# Patient Record
Sex: Female | Born: 1935 | Race: Black or African American | Hispanic: No | Smoking: Never smoker
Health system: Southern US, Community
[De-identification: ages and names within clinical notes are randomized; demographics above are authoritative.]

## PROBLEM LIST (undated history)

## (undated) DIAGNOSIS — F0390 Unspecified dementia without behavioral disturbance: Secondary | ICD-10-CM

## (undated) DIAGNOSIS — E079 Disorder of thyroid, unspecified: Secondary | ICD-10-CM

## (undated) DIAGNOSIS — N189 Chronic kidney disease, unspecified: Secondary | ICD-10-CM

## (undated) DIAGNOSIS — I10 Essential (primary) hypertension: Secondary | ICD-10-CM

## (undated) DIAGNOSIS — C801 Malignant (primary) neoplasm, unspecified: Secondary | ICD-10-CM

## (undated) DIAGNOSIS — E119 Type 2 diabetes mellitus without complications: Secondary | ICD-10-CM

## (undated) HISTORY — DX: Type 2 diabetes mellitus without complications: E11.9

## (undated) HISTORY — DX: Malignant (primary) neoplasm, unspecified: C80.1

## (undated) HISTORY — DX: Essential (primary) hypertension: I10

## (undated) HISTORY — DX: Chronic kidney disease, unspecified: N18.9

## (undated) HISTORY — DX: Disorder of thyroid, unspecified: E07.9

---

## 2015-10-17 ENCOUNTER — Encounter: Payer: Self-pay | Admitting: Internal Medicine

## 2015-10-17 ENCOUNTER — Ambulatory Visit (INDEPENDENT_AMBULATORY_CARE_PROVIDER_SITE_OTHER): Payer: Medicare (Managed Care) | Admitting: Internal Medicine

## 2015-10-17 VITALS — BP 214/60 | HR 73 | Temp 97.4°F | Resp 16 | Ht 67.0 in | Wt 167.0 lb

## 2015-10-17 DIAGNOSIS — Z992 Dependence on renal dialysis: Secondary | ICD-10-CM

## 2015-10-17 DIAGNOSIS — I509 Heart failure, unspecified: Secondary | ICD-10-CM | POA: Diagnosis not present

## 2015-10-17 DIAGNOSIS — Z8739 Personal history of other diseases of the musculoskeletal system and connective tissue: Secondary | ICD-10-CM

## 2015-10-17 DIAGNOSIS — E119 Type 2 diabetes mellitus without complications: Secondary | ICD-10-CM | POA: Diagnosis not present

## 2015-10-17 DIAGNOSIS — E1122 Type 2 diabetes mellitus with diabetic chronic kidney disease: Secondary | ICD-10-CM | POA: Diagnosis not present

## 2015-10-17 DIAGNOSIS — R413 Other amnesia: Secondary | ICD-10-CM | POA: Diagnosis not present

## 2015-10-17 DIAGNOSIS — E038 Other specified hypothyroidism: Secondary | ICD-10-CM

## 2015-10-17 DIAGNOSIS — Z8639 Personal history of other endocrine, nutritional and metabolic disease: Secondary | ICD-10-CM

## 2015-10-17 DIAGNOSIS — I10 Essential (primary) hypertension: Secondary | ICD-10-CM

## 2015-10-17 DIAGNOSIS — I5022 Chronic systolic (congestive) heart failure: Secondary | ICD-10-CM

## 2015-10-17 DIAGNOSIS — N186 End stage renal disease: Secondary | ICD-10-CM

## 2015-10-17 MED ORDER — VITAMIN B-12 1000 MCG PO TABS: 1000.0000 ug | ORAL_TABLET | Freq: Every day | ORAL | Status: AC

## 2015-10-17 MED ORDER — CLONIDINE HCL 0.2 MG PO TABS
0.2000 mg | ORAL_TABLET | Freq: Three times a day (TID) | ORAL | Status: AC
Start: 2015-10-17 — End: ?

## 2015-10-17 NOTE — Progress Notes (Signed)
Pre visit review using our clinic review tool, if applicable. No additional management support is needed unless otherwise documented below in the visit note. 

## 2015-10-17 NOTE — Patient Instructions (Signed)
   Medications reviewed and updated.  No changes recommended at this time.  Your prescription(s) have been submitted to your pharmacy. Please take as directed and contact our office if you believe you are having problem(s) with the medication(s).  A referral was ordered for cardiology, podiatry and neurology.    Please followup in one month

## 2015-10-17 NOTE — Progress Notes (Signed)
Subjective:    Patient ID: Shelly Cain, female    DOB: 05-06-1935, 81 y.o.   MRN: [id number]  HPI She is here to establish with a new pcp.  She is with her son and daughter-in-law.  2 months ago she was moved down here from [place] and is living with her son. All of the details of her history is not known.  Heart failure: Her family noticed that she has a very weak heart. She was following with cardiology in [place]. They do not have any details regarding her history. She does have shortness of breath with exertion. She denies any chest pain or palpitations. She does have lower extremity edema and is on hemodialysis for chronic kidney disease.  Chronic kidney disease, endstage: She has hemodialysis 3 days a week.  Diabetes: She has her sugars checked at hemodialysis 3 days a week. She is taking her medication daily-her family gives it to her. The patient and her family are not aware of her most recent sugar numbers were if her diabetes is controlled.  Hypertension: She is taking her medication daily. She did run out of the clonidine yesterday and has noticed a couple of doses. Her kidney doctor mentioned to her family that her blood pressure was not well controlled.  Gout: She has a history of gout. She is unable to tell me the last episode that she had. She is taking allopurinol 100 mg daily.  Hypothyroidism: She is taking her medication daily.  Concern for memory difficulties/dementia: The nurses at hemodialysis have expressed concern regarding her memory and thought she should be evaluated. Her family notices that she often gets very angry, especially if she is not given full independence. She wonders at night and will walk the hallways. She often will eat several times when they know she probably is not hungry. It is difficult more detail with her in the room because she denies any issues with memory. She states she has been a memory test in the past and passed.  Her family  increase to a neurological evaluation. They did express concern to me privately regarding obtaining power of attorney, which they currently do not have.    Medications and allergies reviewed with patient and updated if appropriate.  Patient Active Problem List   Diagnosis Date Noted  . Diabetes ([place]) 10/18/2015  . Kidney disease, chronic, end stage on dialysis ([place]) 10/18/2015  . Essential hypertension, malignant 10/18/2015  . Hypothyroidism 10/18/2015  . History of gout 10/18/2015     Medication List       This list is accurate as of: 10/17/15 11:59 PM.  Always use your most recent med list.               acetaminophen 325 MG tablet  Commonly known as:  TYLENOL  Take 650 mg by mouth every 4 (four) hours as needed.     allopurinol 100 MG tablet  Commonly known as:  ZYLOPRIM  Take 100 mg by mouth daily.     amLODipine 10 MG tablet  Commonly known as:  NORVASC  Take 10 mg by mouth daily.     aspirin 81 MG tablet  Take 81 mg by mouth daily.     cloNIDine 0.2 MG tablet  Commonly known as:  CATAPRES  Take 1 tablet (0.2 mg total) by mouth 3 (three) times daily.     hydrALAZINE 100 MG tablet  Commonly known as:  APRESOLINE  Take 100 mg by mouth 2 (  two) times daily.     isosorbide dinitrate 20 MG tablet  Commonly known as:  ISORDIL  Take 20 mg by mouth 3 (three) times daily.     levothyroxine 25 MCG tablet  Commonly known as:  SYNTHROID, LEVOTHROID  Take 25 mcg by mouth daily before breakfast.     lisinopril 20 MG tablet  Commonly known as:  PRINIVIL,ZESTRIL  Take 20 mg by mouth daily.     minoxidil 2.5 MG tablet  Commonly known as:  LONITEN  Take 5 mg by mouth 2 (two) times daily.     PRO-STAT PO  Take 30 mLs by mouth 3 (three) times daily.     sevelamer carbonate 800 MG tablet  Commonly known as:  RENVELA  Take 800 mg by mouth 2 (two) times daily with a meal.     TRADJENTA 5 MG Tabs tablet  Generic drug:  linagliptin  Take 5 mg by mouth daily.      vitamin B-12 1000 MCG tablet  Commonly known as:  CYANOCOBALAMIN  Take 1 tablet (1,000 mcg total) by mouth daily.         Past Medical History  Diagnosis Date  . Hypertension   . Diabetes mellitus without complication ([place])   . Chronic kidney disease   . Cancer [facility])     Colon  . Thyroid disease     History reviewed. No pertinent past surgical history.  Social History   Social History  . Marital Status: Widowed    Spouse Name: N/A  . Number of Children: N/A  . Years of Education: N/A   Social History Main Topics  . Smoking status: Never Smoker   . Smokeless tobacco: Never Used  . Alcohol Use: No  . Drug Use: No  . Sexual Activity: Not Asked   Other Topics Concern  . None   Social History Narrative  . None    History reviewed. No pertinent family history.  Review of Systems  Constitutional: Negative for fever and chills.  HENT: Negative for hearing loss.   Eyes: Negative for visual disturbance.  Respiratory: Negative for cough, shortness of breath and wheezing.   Cardiovascular: Positive for leg swelling. Negative for chest pain and palpitations.  Gastrointestinal: Negative for abdominal pain, diarrhea and constipation.       No gerd  Genitourinary: Negative for difficulty urinating.  Musculoskeletal: Positive for arthralgias.  Neurological: Negative for dizziness, weakness, light-headedness, numbness and headaches.       Poor balance - uses walker  Psychiatric/Behavioral: Positive for sleep disturbance. Negative for dysphoric mood. The patient is not nervous/anxious.        No memory problems Years - 2017 city:  [place], [place] President: " with the hair"  Takes own medication -- needs reminding per family        Objective:   Filed Vitals:   10/17/15 1533 10/17/15 1546  BP: 230/80 214/60  Pulse: 73   Temp: 97.4 F (36.3 C)   Resp: 16    Filed Weights   10/17/15 1533  Weight: 167 lb (75.751 kg)   Body mass index is 26.15 kg/(m^2).    Physical Exam Constitutional: She appears well-developed and well-nourished. No distress.  HENT:  Head: Normocephalic and atraumatic.  Right Ear: External ear normal. Normal ear canal and TM Left Ear: External ear normal.  Normal ear canal and TM Mouth/Throat: Oropharynx is clear and moist.  Eyes: Conjunctivae and EOM are normal.  Neck: Neck supple. No tracheal deviation present. No thyromegaly  present.  No carotid bruit  Cardiovascular: Normal rate, regular rhythm and normal heart sounds.   3/6 systolic heard.  mild ankle edema. Pulmonary/Chest: Effort normal and breath sounds normal. No respiratory distress. She has no wheezes. She has no rales.  Abdominal: Soft. She exhibits no distension. There is no tenderness.  Lymphadenopathy: She has no cervical adenopathy.  Skin: Skin is warm and dry. She is not diaphoretic.  Psychiatric: She has a normal mood and affect. Her behavior is normal.       Assessment & Plan:   See Problem List for Assessment and Plan of chronic medical problems.  Follow-up in one month

## 2015-10-18 DIAGNOSIS — E119 Type 2 diabetes mellitus without complications: Secondary | ICD-10-CM | POA: Insufficient documentation

## 2015-10-18 DIAGNOSIS — Z992 Dependence on renal dialysis: Secondary | ICD-10-CM

## 2015-10-18 DIAGNOSIS — Z8739 Personal history of other diseases of the musculoskeletal system and connective tissue: Secondary | ICD-10-CM | POA: Insufficient documentation

## 2015-10-18 DIAGNOSIS — N186 End stage renal disease: Secondary | ICD-10-CM | POA: Insufficient documentation

## 2015-10-18 DIAGNOSIS — I10 Essential (primary) hypertension: Secondary | ICD-10-CM | POA: Insufficient documentation

## 2015-10-18 DIAGNOSIS — E039 Hypothyroidism, unspecified: Secondary | ICD-10-CM | POA: Insufficient documentation

## 2015-10-18 DIAGNOSIS — I5022 Chronic systolic (congestive) heart failure: Secondary | ICD-10-CM | POA: Insufficient documentation

## 2015-10-18 DIAGNOSIS — R413 Other amnesia: Secondary | ICD-10-CM | POA: Insufficient documentation

## 2015-10-18 NOTE — Assessment & Plan Note (Signed)
Her family knows that she has a very weak heart, but no other details. Significant murmur on exam We will work on obtaining records from [place] I will refer to cardiology - will let them obtain echo

## 2015-10-18 NOTE — Assessment & Plan Note (Signed)
Doing hemodialysis 3 times a week

## 2015-10-18 NOTE — Assessment & Plan Note (Signed)
Need to check TSH and adjust medication as needed We'll see if she can have complete blood work done at hemodialysis since that we do not have to get blood work

## 2015-10-18 NOTE — Assessment & Plan Note (Signed)
Referral to neurology for full evaluation

## 2015-10-18 NOTE — Assessment & Plan Note (Signed)
?   Last gout attack Continue allopurinol 100 mg daily

## 2015-10-18 NOTE — Assessment & Plan Note (Addendum)
We'll see if A1c can be checked at hemodialysis We will try to obtain most recent records from hemodialysis to evaluate her glucose Continue current medications for now Referral to podiatry for foot care

## 2015-10-18 NOTE — Assessment & Plan Note (Signed)
Poorly controlled, but she did not take one of her medications for the past 24 hours. Still likely not controlled Medication renewed Follow-up in one month Referred to cardiology for further evaluation and help with hypertension control

## 2015-10-21 ENCOUNTER — Encounter: Payer: Self-pay | Admitting: Neurology

## 2015-10-21 ENCOUNTER — Ambulatory Visit (INDEPENDENT_AMBULATORY_CARE_PROVIDER_SITE_OTHER): Payer: Medicare (Managed Care) | Admitting: Neurology

## 2015-10-21 VITALS — BP 190/60 | HR 75 | Ht 67.0 in | Wt 163.0 lb

## 2015-10-21 DIAGNOSIS — Z794 Long term (current) use of insulin: Secondary | ICD-10-CM

## 2015-10-21 DIAGNOSIS — Z992 Dependence on renal dialysis: Secondary | ICD-10-CM

## 2015-10-21 DIAGNOSIS — G309 Alzheimer's disease, unspecified: Secondary | ICD-10-CM

## 2015-10-21 DIAGNOSIS — N186 End stage renal disease: Secondary | ICD-10-CM | POA: Diagnosis not present

## 2015-10-21 DIAGNOSIS — N2889 Other specified disorders of kidney and ureter: Secondary | ICD-10-CM

## 2015-10-21 DIAGNOSIS — F0280 Dementia in other diseases classified elsewhere without behavioral disturbance: Secondary | ICD-10-CM

## 2015-10-21 DIAGNOSIS — I151 Hypertension secondary to other renal disorders: Secondary | ICD-10-CM | POA: Diagnosis not present

## 2015-10-21 DIAGNOSIS — E1122 Type 2 diabetes mellitus with diabetic chronic kidney disease: Secondary | ICD-10-CM

## 2015-10-21 MED ORDER — DONEPEZIL HCL 5 MG PO TABS: 5.0000 mg | ORAL_TABLET | Freq: Every day | ORAL | Status: DC

## 2015-10-21 NOTE — Progress Notes (Signed)
NEUROLOGY CONSULTATION NOTE  Shelly Cain MRN: [id number] DOB: 01-15-1935  Referring provider: Dr. Quay Burow Primary care provider: Dr. Quay Burow  Reason for consult:  Memory loss  HISTORY OF PRESENT ILLNESS: Shelly Cain is a 81 year old right-handed woman with hypertension, type 2 diabetes, end stage kidney disease on dialysis, hypothyroidism and history of colon cancer who presents for memory problems.  History obtained by patient, her son and PCP note.    Shelly Cain moved down to [place] from [place] in March.  She was living in her own apartment.  At that time, her son received a phone call stating that her mother was not doing well.  When his brother went up to see her, they found out she was in the hospital.  She was found to be in renal failure.  She was in rehab afterwards for 2 months.  When they entered her apartment, everything was in disarray.  Her bills were piled up and had not been paid.  She had lost significant amount of weight.  This was a difference since he last saw her over a year prior.  When speaking on the phone, she seemed okay.  She now lives with her son, daughter-in-law and grandson.  She is looked after by her son and daughter-in-law, who is a former [employer] scientist.  She has 24 hour supervision.  She demonstrates short-term memory problems.  When she walks to her bedroom, she sometimes enters the wrong room.  When she finishes dinner, she sometimes returns to the kitchen 5 minutes later and has another meal.  Overall, she is able to recognize her family but sometimes she appears not to recognize her son.  She is able to perform her ADLs, such as dressing and bathing, but requires assistance.  She is incontinent and wears diapers.  Her family manages her medication.  Sometimes in the evening, she wants to go out for a walk and will sit in the car.  She is up at night sometimes, but does not has not tried to leave the house.  Her daughter-in-law watches her in the  middle of the night when her son is sleeping.  She is not combative.  She has not demonstrated hallucinations.  She talks about the past often.  There is no known family history of dementia.  She went to school up until 10th grade.  She no longer drives. She takes B12 supplements.  PAST MEDICAL HISTORY: Past Medical History  Diagnosis Date  . Hypertension   . Diabetes mellitus without complication ([place])   . Chronic kidney disease   . Cancer [facility])     Colon  . Thyroid disease     PAST SURGICAL HISTORY: History reviewed. No pertinent past surgical history.  MEDICATIONS: Current Outpatient Prescriptions on File Prior to Visit  Medication Sig Dispense Refill  . acetaminophen (TYLENOL) 325 MG tablet Take 650 mg by mouth every 4 (four) hours as needed.    Marland Kitchen allopurinol (ZYLOPRIM) 100 MG tablet Take 100 mg by mouth daily.    . Amino Acids-Protein Hydrolys (PRO-STAT PO) Take 30 mLs by mouth 3 (three) times daily.    Marland Kitchen amLODipine (NORVASC) 10 MG tablet Take 10 mg by mouth daily.    Marland Kitchen aspirin 81 MG tablet Take 81 mg by mouth daily.    . cloNIDine (CATAPRES) 0.2 MG tablet Take 1 tablet (0.2 mg total) by mouth 3 (three) times daily. 270 tablet 1  . hydrALAZINE (APRESOLINE) 100 MG tablet Take 100 mg by  mouth 2 (two) times daily.    . isosorbide dinitrate (ISORDIL) 20 MG tablet Take 20 mg by mouth 3 (three) times daily.    Marland Kitchen levothyroxine (SYNTHROID, LEVOTHROID) 25 MCG tablet Take 25 mcg by mouth daily before breakfast.    . linagliptin (TRADJENTA) 5 MG TABS tablet Take 5 mg by mouth daily.    Marland Kitchen lisinopril (PRINIVIL,ZESTRIL) 20 MG tablet Take 20 mg by mouth daily.    . minoxidil (LONITEN) 2.5 MG tablet Take 5 mg by mouth 2 (two) times daily.    . sevelamer carbonate (RENVELA) 800 MG tablet Take 800 mg by mouth 2 (two) times daily with a meal.    . vitamin B-12 (CYANOCOBALAMIN) 1000 MCG tablet Take 1 tablet (1,000 mcg total) by mouth daily. 90 tablet 3   No current facility-administered  medications on file prior to visit.    ALLERGIES: No Known Allergies  FAMILY HISTORY: History reviewed. No pertinent family history.  SOCIAL HISTORY: Social History   Social History  . Marital Status: Widowed    Spouse Name: N/A  . Number of Children: N/A  . Years of Education: N/A   Occupational History  . Not on file.   Social History Main Topics  . Smoking status: Never Smoker   . Smokeless tobacco: Never Used  . Alcohol Use: No  . Drug Use: No  . Sexual Activity: Not on file   Other Topics Concern  . Not on file   Social History Narrative    REVIEW OF SYSTEMS: Constitutional: No fevers, chills, or sweats, no generalized fatigue, change in appetite Eyes: No visual changes, double vision, eye pain Ear, nose and throat: No hearing loss, ear pain, nasal congestion, sore throat Cardiovascular: No chest pain, palpitations Respiratory:  No shortness of breath at rest or with exertion, wheezes GastrointestinaI: No nausea, vomiting, diarrhea, abdominal pain, fecal incontinence Genitourinary:  No dysuria, urinary retention or frequency Musculoskeletal:  No neck pain, back pain Integumentary: No rash, pruritus, skin lesions Neurological: as above Psychiatric: No depression, insomnia, anxiety Endocrine: No palpitations, fatigue, diaphoresis, mood swings, change in appetite, change in weight, increased thirst Hematologic/Lymphatic:  No anemia, purpura, petechiae. Allergic/Immunologic: no itchy/runny eyes, nasal congestion, recent allergic reactions, rashes  PHYSICAL EXAM: Filed Vitals:   10/21/15 0857  BP: 190/60  Pulse: 75   General: No acute distress.   Head:  Normocephalic/atraumatic Eyes:  fundi examined but not visualized Neck: supple, no paraspinal tenderness, full range of motion Back: No paraspinal tenderness Heart: regular rate and rhythm Lungs: Clear to auscultation bilaterally. Vascular: No carotid bruits. Neurological Exam: Mental status: alert and  oriented to person, place, and day (but not month, date or year), recent memory poor, remote memory intact, fund of knowledge impaired, attention and concentration impaired, speech fluent and not dysarthric, difficulty with naming and naming fluency, able to answer questions and follow commands.  Visuospatial and executive functioning impaired. Montreal Cognitive Assessment  10/21/2015  Visuospatial/ Executive (0/5) 0  Naming (0/3) 1  Attention: Read list of digits (0/2) 2  Attention: Read list of letters (0/1) 0  Attention: Serial 7 subtraction starting at 100 (0/3) 0  Language: Repeat phrase (0/2) 2  Language : Fluency (0/1) 0  Abstraction (0/2) 0  Delayed Recall (0/5) 0  Orientation (0/6) 3  Total 8  Adjusted Score (based on education) 9   Cranial nerves: CN I: not tested CN II: pupils equal, round and reactive to light, visual fields intact CN III, IV, VI:  full range of  motion, no nystagmus, no ptosis CN V: facial sensation intact CN VII: upper and lower face symmetric CN VIII: hearing intact CN IX, X: gag intact, uvula midline CN XI: sternocleidomastoid and trapezius muscles intact CN XII: tongue midline Bulk & Tone: lower extremity atrophy, tone normal, no fasciculations. Motor:  4+/5 bilateral hip flexion, otherwise 5/5 throughout  Sensation:  Pinprick and vibration sensation intact. Deep Tendon Reflexes:  2+ throughout, toes downgoing.  Finger to nose testing:  Without dysmetria.  Gait:  Requires use of arms to push up out of seat.  Significant difficulty moving legs without walker.  IMPRESSION: Dementia, probably Alzheimer's End-stage renal disease Type 2 diabetes HTN  PLAN: 1.  We will start donepezil (Aricept) 5mg  daily for four weeks.  If tolerating the medication, then after four weeks, we will increase the dose to 10mg  daily.  Side effects discussed. 2.  I would like to get notes from [place] (hospital notes, labs, etc) for review 3.  Mrs. Dougal needs 24 hour  supervision 4.  We will order a Home Health assessment.  She will need physical therapy. 5.  Use walker at all times 6.  Please review Alzheimer's support groups and websites for information 7.  Recommend follow up with podiatry and ophthalmology 8.  Blood pressure very high.  Follow up with PCP. 9.  Follow up in 7 months.  Thank you for allowing me to take part in the care of this patient.  Metta Clines, DO  CC: Billey Gosling, MD

## 2015-10-21 NOTE — Patient Instructions (Signed)
I do believe that she has Alzheimer's disease. 1.  We will start donepezil (Aricept) 5mg  daily for four weeks.  If you are tolerating the medication, then after four weeks, we will increase the dose to 10mg  daily.  Side effects include nausea, vomiting, diarrhea, vivid dreams, and muscle cramps.  Please call the clinic if you experience any of these symptoms. 2.  I would like to get notes from [place] (hospital notes, labs, etc) for review 3.  Shelly Cain needs 24 hour supervision 4.  We will order a Home Health assessment.  She will need physical therapy. 5.  Use walker at all times 6.  Please review Alzheimer's support groups and websites for information 7.  Follow up in 7 months.

## 2015-10-22 ENCOUNTER — Telehealth: Payer: Self-pay

## 2015-10-22 NOTE — Telephone Encounter (Signed)
-----   Message from Sherlynn Carbon sent at 10/22/2015  2:02 PM EDT ----- Message was left that PT is refusing home health care/[facility]

## 2015-10-22 NOTE — Telephone Encounter (Signed)
FYI  Please see below

## 2015-11-05 ENCOUNTER — Encounter: Payer: Self-pay | Admitting: Podiatry

## 2015-11-05 ENCOUNTER — Ambulatory Visit (INDEPENDENT_AMBULATORY_CARE_PROVIDER_SITE_OTHER): Payer: Medicare (Managed Care) | Admitting: Podiatry

## 2015-11-05 VITALS — BP 106/66 | HR 90 | Resp 12

## 2015-11-05 DIAGNOSIS — M79674 Pain in right toe(s): Secondary | ICD-10-CM | POA: Diagnosis not present

## 2015-11-05 DIAGNOSIS — B351 Tinea unguium: Secondary | ICD-10-CM

## 2015-11-05 DIAGNOSIS — M79675 Pain in left toe(s): Secondary | ICD-10-CM

## 2015-11-05 NOTE — Progress Notes (Signed)
   Subjective:    Patient ID: Shelly Cain, female    DOB: 1934/10/21, 81 y.o.   MRN: [id number]  HPI     This patient presents today with her son in the treatment room complaining of elongated and thickened toenails which are uncomfortable walking wearing shoes over , undetermined amount of time and requests toenail debridement. Patient has relocated from [place] and lives in a facility for rehabilitation. Patient has not had nail debridement since leaving [place]. Patient has difficulty recalling exact dates and times for treatment.  Patient is a diabetic and she denies history of skin ulceration, claudication or amputation  Review of Systems  Eyes: Positive for redness.  Respiratory: Positive for cough.   Gastrointestinal: Positive for diarrhea.  Psychiatric/Behavioral: Positive for behavioral problems.  All other systems reviewed and are negative.      Objective:   Physical Exam  Patient is pleasant and does respond to questioning, however, her response to questioning is sometimes different from her sons recollection of her problems.  Vascular: No calf edema or calf tenderness bilaterally Pitting edema right greater than left DP pulses 1/4 bilaterally PT pulse right 0/4 PT left 1/4 Capillary reflex immediate  Neurological: Sensation to 10 g monofilament wire intact 3/5 right and 4/5 left Vibratory sensation nonreactive bilaterally Ankle reflexes weakly reactive bilaterally  Dermatological: No open skin lesions bilaterally The toenails are extremely elongated, hypertrophic, deformed, discolored and tender to direct palpation 6-10  Musculoskeletal: Patient walks slowly with a roller walker assistance Pes planus bilaterally HAV right There is no restriction ankle, subtalar, midtarsal joints bilaterally       Assessment & Plan:   Assessment: Decrease pedal pulses suggestive of possible peripheral arterial disease Decreased sensation suggested of  peripheral neuropathy Diabetic Symptomatic neglected onychomycoses 6-10  Plan: At this time as patient has no open lesions and no history of amputation I recommended surveillance and return at three-month intervals. Also I recommended nail debridement and patient and son verbally consent The toenails 6-10 are debrided mechanically and electronically without any bleeding  Reappoint 3 months

## 2015-11-05 NOTE — Patient Instructions (Signed)
Diabetes and Foot Care Diabetes may cause you to have problems because of poor blood supply (circulation) to your feet and legs. This may cause the skin on your feet to become thinner, break easier, and heal more slowly. Your skin may become dry, and the skin may peel and crack. You may also have nerve damage in your legs and feet causing decreased feeling in them. You may not notice minor injuries to your feet that could lead to infections or more serious problems. Taking care of your feet is one of the most important things you can do for yourself.  HOME CARE INSTRUCTIONS  Wear shoes at all times, even in the house. Do not go barefoot. Bare feet are easily injured.  Check your feet daily for blisters, cuts, and redness. If you cannot see the bottom of your feet, use a mirror or ask someone for help.  Wash your feet with warm water (do not use hot water) and mild soap. Then pat your feet and the areas between your toes until they are completely dry. Do not soak your feet as this can dry your skin.  Apply a moisturizing lotion or petroleum jelly (that does not contain alcohol and is unscented) to the skin on your feet and to dry, brittle toenails. Do not apply lotion between your toes.  Trim your toenails straight across. Do not dig under them or around the cuticle. File the edges of your nails with an emery board or nail file.  Do not cut corns or calluses or try to remove them with medicine.  Wear clean socks or stockings every day. Make sure they are not too tight. Do not wear knee-high stockings since they may decrease blood flow to your legs.  Wear shoes that fit properly and have enough cushioning. To break in new shoes, wear them for just a few hours a day. This prevents you from injuring your feet. Always look in your shoes before you put them on to be sure there are no objects inside.  Do not cross your legs. This may decrease the blood flow to your feet.  If you find a minor scrape,  cut, or break in the skin on your feet, keep it and the skin around it clean and dry. These areas may be cleansed with mild soap and water. Do not cleanse the area with peroxide, alcohol, or iodine.  When you remove an adhesive bandage, be sure not to damage the skin around it.  If you have a wound, look at it several times a day to make sure it is healing.  Do not use heating pads or hot water bottles. They may burn your skin. If you have lost feeling in your feet or legs, you may not know it is happening until it is too late.  Make sure your health care provider performs a complete foot exam at least annually or more often if you have foot problems. Report any cuts, sores, or bruises to your health care provider immediately. SEEK MEDICAL CARE IF:   You have an injury that is not healing.  You have cuts or breaks in the skin.  You have an ingrown nail.  You notice redness on your legs or feet.  You feel burning or tingling in your legs or feet.  You have pain or cramps in your legs and feet.  Your legs or feet are numb.  Your feet always feel cold. SEEK IMMEDIATE MEDICAL CARE IF:   There is increasing redness,   swelling, or pain in or around a wound.  There is a red line that goes up your leg.  Pus is coming from a wound.  You develop a fever or as directed by your health care provider.  You notice a bad smell coming from an ulcer or wound.   This information is not intended to replace advice given to you by your health care provider. Make sure you discuss any questions you have with your health care provider.   Document Released: 05/28/2000 Document Revised: 01/31/2013 Document Reviewed: 11/07/2012 Elsevier Interactive Patient Education 2016 [employer]  
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
## 2015-11-12 ENCOUNTER — Ambulatory Visit (INDEPENDENT_AMBULATORY_CARE_PROVIDER_SITE_OTHER): Payer: Medicare (Managed Care) | Admitting: Cardiovascular Disease

## 2015-11-12 ENCOUNTER — Encounter: Payer: Self-pay | Admitting: Cardiovascular Disease

## 2015-11-12 VITALS — BP 121/52 | HR 77 | Ht 66.0 in | Wt 149.8 lb

## 2015-11-12 DIAGNOSIS — I5022 Chronic systolic (congestive) heart failure: Secondary | ICD-10-CM

## 2015-11-12 DIAGNOSIS — I509 Heart failure, unspecified: Secondary | ICD-10-CM

## 2015-11-12 DIAGNOSIS — R011 Cardiac murmur, unspecified: Secondary | ICD-10-CM | POA: Diagnosis not present

## 2015-11-12 DIAGNOSIS — Z13220 Encounter for screening for lipoid disorders: Secondary | ICD-10-CM | POA: Diagnosis not present

## 2015-11-12 DIAGNOSIS — Z79899 Other long term (current) drug therapy: Secondary | ICD-10-CM | POA: Diagnosis not present

## 2015-11-12 DIAGNOSIS — I10 Essential (primary) hypertension: Secondary | ICD-10-CM

## 2015-11-12 NOTE — Patient Instructions (Addendum)
Medication Instructions:  Your physician recommends that you continue on your current medications as directed. Please refer to the Current Medication list given to you today.  Labwork: Fasting lipid/cmet/cbc soon at [facility] lab on first floor   Testing/Procedures: Your physician has requested that you have an echocardiogram. Echocardiography is a painless test that uses sound waves to create images of your heart. It provides your doctor with information about the size and shape of your heart and how well your heart's chambers and valves are working. This procedure takes approximately one hour. There are no restrictions for this procedure.  Follow-Up: Your physician recommends that you schedule a follow-up appointment in: 3 month ov  If you need a refill on your cardiac medications before your next appointment, please call your pharmacy.

## 2015-11-14 LAB — CBC WITH DIFFERENTIAL/PLATELET
BASOS ABS: 41 {cells}/uL (ref 0–200)
BASOS PCT: 1 %
EOS ABS: 164 {cells}/uL (ref 15–500)
Eosinophils Relative: 4 %
HEMATOCRIT: 42.5 % (ref 35.0–45.0)
Hemoglobin: 12.9 g/dL (ref 11.7–15.5)
LYMPHS PCT: 25 %
Lymphs Abs: 1025 cells/uL (ref 850–3900)
MCH: 29 pg (ref 27.0–33.0)
MCHC: 30.4 g/dL — AB (ref 32.0–36.0)
MCV: 95.5 fL (ref 80.0–100.0)
MONO ABS: 369 {cells}/uL (ref 200–950)
MONOS PCT: 9 %
MPV: 10.5 fL (ref 7.5–12.5)
NEUTROS PCT: 61 %
Neutro Abs: 2501 cells/uL (ref 1500–7800)
PLATELETS: 126 10*3/uL — AB (ref 140–400)
RBC: 4.45 MIL/uL (ref 3.80–5.10)
RDW: 17 % — AB (ref 11.0–15.0)
WBC: 4.1 10*3/uL (ref 3.8–10.8)

## 2015-11-14 LAB — COMPREHENSIVE METABOLIC PANEL
ALBUMIN: 4.2 g/dL (ref 3.6–5.1)
ALK PHOS: 41 U/L (ref 33–130)
ALT: 10 U/L (ref 6–29)
AST: 17 U/L (ref 10–35)
BUN: 30 mg/dL — ABNORMAL HIGH (ref 7–25)
CHLORIDE: 99 mmol/L (ref 98–110)
CO2: 27 mmol/L (ref 20–31)
CREATININE: 4.37 mg/dL — AB (ref 0.60–0.93)
Calcium: 9.3 mg/dL (ref 8.6–10.4)
GLUCOSE: 84 mg/dL (ref 65–99)
Potassium: 5 mmol/L (ref 3.5–5.3)
SODIUM: 140 mmol/L (ref 135–146)
Total Bilirubin: 0.4 mg/dL (ref 0.2–1.2)
Total Protein: 6.2 g/dL (ref 6.1–8.1)

## 2015-11-14 LAB — LIPID PANEL
CHOL/HDL RATIO: 2 ratio (ref <=5.0)
CHOLESTEROL: 181 mg/dL (ref 125–200)
HDL: 92 mg/dL (ref >=46)
LDL Cholesterol: 69 mg/dL (ref <130)
TRIGLYCERIDES: 100 mg/dL (ref <150)
VLDL: 20 mg/dL (ref <30)

## 2015-11-19 ENCOUNTER — Other Ambulatory Visit: Payer: Self-pay | Admitting: Neurology

## 2015-11-19 ENCOUNTER — Telehealth: Payer: Self-pay | Admitting: Neurology

## 2015-11-19 ENCOUNTER — Telehealth: Payer: Self-pay | Admitting: *Deleted

## 2015-11-19 NOTE — Telephone Encounter (Signed)
Shelly Cain is returning your call

## 2015-11-19 NOTE — Telephone Encounter (Signed)
Melitta Ferritto 01/02/2035. Someone called for her about medication (Aricept). 5 Mg  He said he hasn't noticed any side effects. She has been irritable, but he said she was that way before the medication. He said it should be ok to take it up to 10 Mg. He feels she is the same if not worse. His number is the same one you called him on before. Thank you

## 2015-11-19 NOTE — Telephone Encounter (Signed)
1. We will start donepezil (Aricept) 5mg  daily for four weeks. If tolerating the medication, then after four weeks, we will increase the dose to 10mg  daily. Side effects discussed.  Last OV: 10/21/15 Next OV: 05/24/16

## 2015-11-19 NOTE — Telephone Encounter (Signed)
-----   Message from Skeet Latch, MD sent at 11/16/2015 10:31 PM EDT ----- Cholesterol levels are very good.  Electrolytes and blood counts are stable.  Platelets are a little low but not low enough to cause any problems with bleeding.

## 2015-11-19 NOTE — Telephone Encounter (Signed)
Electronic refill request. Will call patient/family after 8:00 a.m. To find out if pt is tolerating medication and needs 10 mg or 5 mg refill sent in. Will submit after speaking to someone.

## 2015-11-19 NOTE — Telephone Encounter (Signed)
Please see below.

## 2015-11-19 NOTE — Telephone Encounter (Signed)
Message left on pt's vm

## 2015-11-19 NOTE — Telephone Encounter (Signed)
Advised son of results and will send to Dr Jenness Corner

## 2015-11-19 NOTE — Telephone Encounter (Signed)
Left message to call back  

## 2015-11-20 MED ORDER — DONEPEZIL HCL 10 MG PO TABS: 10.0000 mg | ORAL_TABLET | Freq: Every day | ORAL | Status: DC

## 2015-11-20 NOTE — Telephone Encounter (Signed)
RX for Aricept 10 mg was sent to pharmacy. Please see prior telephone message from [place] about medication being upped.

## 2015-11-20 NOTE — Addendum Note (Signed)
Addended by: Gerda Diss A on: 11/20/2015 07:24 AM   Modules accepted: Orders

## 2015-11-21 ENCOUNTER — Encounter: Payer: Self-pay | Admitting: Internal Medicine

## 2015-11-21 ENCOUNTER — Ambulatory Visit (INDEPENDENT_AMBULATORY_CARE_PROVIDER_SITE_OTHER): Payer: Medicare (Managed Care) | Admitting: Internal Medicine

## 2015-11-21 VITALS — BP 210/62 | HR 75 | Temp 98.5°F | Resp 18 | Wt 153.0 lb

## 2015-11-21 DIAGNOSIS — I10 Essential (primary) hypertension: Secondary | ICD-10-CM | POA: Diagnosis not present

## 2015-11-21 DIAGNOSIS — N186 End stage renal disease: Secondary | ICD-10-CM

## 2015-11-21 DIAGNOSIS — E1122 Type 2 diabetes mellitus with diabetic chronic kidney disease: Secondary | ICD-10-CM

## 2015-11-21 DIAGNOSIS — R413 Other amnesia: Secondary | ICD-10-CM

## 2015-11-21 DIAGNOSIS — Z992 Dependence on renal dialysis: Secondary | ICD-10-CM

## 2015-11-21 NOTE — Progress Notes (Signed)
Pre visit review using our clinic review tool, if applicable. No additional management support is needed unless otherwise documented below in the visit note. 

## 2015-11-21 NOTE — Assessment & Plan Note (Signed)
BP Readings from Last 3 Encounters:  11/21/15 210/62  11/12/15 121/52  11/05/15 106/66   BP has been variable and I will not make any changes to her medication Echo will be done later this month Continue current meds at current doses

## 2015-11-21 NOTE — Patient Instructions (Signed)
  Medications reviewed and updated.  No changes recommended at this time.     Please followup in 6 months   

## 2015-11-21 NOTE — Assessment & Plan Note (Signed)
Diet controlled Sugars are well controlled Continue diabetic diet

## 2015-11-21 NOTE — Assessment & Plan Note (Signed)
HD three times a week Saw nephrology yesterday

## 2015-11-21 NOTE — Assessment & Plan Note (Signed)
Following with Dr Tomi Likens On aricept - ? Side effects or not Will follow up with neuro regarding possible side effects and a possible medication for nighttime

## 2015-11-21 NOTE — Progress Notes (Signed)
Subjective:    Patient ID: Shelly Cain, female    DOB: 1935-02-26, 81 y.o.   MRN: [id number]  HPI She is here for follow up.  Hypertension: She is taking her medication daily. She is compliant with a low sodium diet.  She denies chest pain, palpitations, edema, shortness of breath and regular headaches. She is not exercising regularly.  She does not monitor her blood pressure at home, but it is checked at HD.  She has had several very good readings.    CKD: Doing HD three times a week.  She had HD yesterday.    Diabetes: She is taking her medication daily as prescribed. She is compliant with a diabetic diet. She monitors her sugars and they have been running 102 yesterday at HD. They are checked there three times a week and have always been good.  They do not check her sugars at home.   Memory issues:  She has seen neuro and was started on aricept.  Her family is unsure if she is having side effects or not.  She sometimes complains of stomach pain.  She has had increased agitation at times and they are unsure if this is related to the medication of not.  She does not sleep well and often wanders.  She does not like her son to come to HD, but the staff want him there. She does not understand that is angry at him.     Medications and allergies reviewed with patient and updated if appropriate.  Patient Active Problem List   Diagnosis Date Noted  . Diabetes ([place]) 10/18/2015  . Kidney disease, chronic, end stage on dialysis ([place]) 10/18/2015  . Essential hypertension, malignant 10/18/2015  . Hypothyroidism 10/18/2015  . History of gout 10/18/2015  . Chronic systolic congestive heart failure ([place]) 10/18/2015  . Memory difficulties 10/18/2015    Current Outpatient Prescriptions on File Prior to Visit  Medication Sig Dispense Refill  . acetaminophen (TYLENOL) 325 MG tablet Take 650 mg by mouth every 4 (four) hours as needed.    Marland Kitchen allopurinol (ZYLOPRIM) 100 MG tablet Take 100 mg by  mouth daily.    . Amino Acids-Protein Hydrolys (PRO-STAT PO) Take 30 mLs by mouth 3 (three) times daily.    Marland Kitchen amLODipine (NORVASC) 10 MG tablet Take 10 mg by mouth daily.    Marland Kitchen aspirin 81 MG tablet Take 81 mg by mouth daily.    . cloNIDine (CATAPRES) 0.2 MG tablet Take 1 tablet (0.2 mg total) by mouth 3 (three) times daily. 270 tablet 1  . donepezil (ARICEPT) 10 MG tablet Take 1 tablet (10 mg total) by mouth at bedtime. 30 tablet 3  . hydrALAZINE (APRESOLINE) 100 MG tablet Take 100 mg by mouth 2 (two) times daily.    . isosorbide dinitrate (ISORDIL) 20 MG tablet Take 20 mg by mouth 3 (three) times daily.    Marland Kitchen levothyroxine (SYNTHROID, LEVOTHROID) 25 MCG tablet Take 25 mcg by mouth daily before breakfast.    . linagliptin (TRADJENTA) 5 MG TABS tablet Take 5 mg by mouth daily.    Marland Kitchen lisinopril (PRINIVIL,ZESTRIL) 20 MG tablet Take 20 mg by mouth daily.    . minoxidil (LONITEN) 2.5 MG tablet Take 5 mg by mouth 2 (two) times daily.    . sevelamer carbonate (RENVELA) 800 MG tablet Take 800 mg by mouth 2 (two) times daily with a meal.    . vitamin B-12 (CYANOCOBALAMIN) 1000 MCG tablet Take 1 tablet (1,000 mcg total) by mouth  daily. 90 tablet 3   No current facility-administered medications on file prior to visit.    Past Medical History  Diagnosis Date  . Hypertension   . Diabetes mellitus without complication ([place])   . Chronic kidney disease   . Cancer [facility])     Colon  . Thyroid disease     No past surgical history on file.  Social History   Social History  . Marital Status: Widowed    Spouse Name: N/A  . Number of Children: N/A  . Years of Education: N/A   Social History Main Topics  . Smoking status: Never Smoker   . Smokeless tobacco: Never Used  . Alcohol Use: No  . Drug Use: No  . Sexual Activity: Not Asked   Other Topics Concern  . None   Social History Narrative    No family history on file.  Review of Systems  Constitutional: Negative for appetite change.  HENT:  Negative for trouble swallowing.   Respiratory: Positive for cough. Negative for shortness of breath and wheezing.   Cardiovascular: Negative for chest pain, palpitations and leg swelling.  Gastrointestinal: Positive for abdominal pain (sometimes).  Neurological: Negative for dizziness, light-headedness and headaches.       Objective:   Filed Vitals:   11/21/15 1327  BP: 210/62  Pulse: 75  Temp: 98.5 F (36.9 C)  Resp: 18   Filed Weights   11/21/15 1327  Weight: 153 lb (69.4 kg)   Body mass index is 24.71 kg/(m^2).   Physical Exam Constitutional: Appears well-developed and well-nourished. No distress.  Neck: Neck supple. No tracheal deviation present. No thyromegaly present.  No carotid bruit. No cervical adenopathy.   Cardiovascular: Normal rate, regular rhythm and normal heart sounds.   3/6 systolic murmur heard.  mild edema Pulmonary/Chest: Effort normal and breath sounds normal. No respiratory distress. No wheezes.  Ext: fistula in left forearm with palpable thrill      Assessment & Plan:    See Problem List for Assessment and Plan of chronic medical problems.   F/u in 6 months, sooner if needed

## 2015-11-25 ENCOUNTER — Telehealth: Payer: Self-pay | Admitting: Neurology

## 2015-11-25 NOTE — Telephone Encounter (Signed)
Spoke with son, pt is declining. Would like something about pt's condition to help him obtain POV. Will print out most recent office visit notes for son. Son will contact us if he needs more information than that, or in a letter form after speaking to a lawyer.

## 2015-11-25 NOTE — Telephone Encounter (Signed)
Printed and mailed to son.

## 2015-11-25 NOTE — Telephone Encounter (Signed)
Pt son needs to talk someone about mother he would like to be her POA and needs a letter please call Kyung Rudd at [phone]

## 2015-11-28 ENCOUNTER — Other Ambulatory Visit (HOSPITAL_COMMUNITY): Payer: Self-pay

## 2015-12-12 ENCOUNTER — Encounter: Payer: Self-pay | Admitting: Internal Medicine

## 2015-12-12 ENCOUNTER — Telehealth: Payer: Self-pay

## 2015-12-12 NOTE — Telephone Encounter (Signed)
Pt had a negative reaction to Aricept, vomiting and diarrhea. Family stopped medication.

## 2015-12-19 ENCOUNTER — Ambulatory Visit (HOSPITAL_COMMUNITY): Payer: Medicare (Managed Care) | Attending: Cardiovascular Disease

## 2015-12-19 ENCOUNTER — Other Ambulatory Visit: Payer: Self-pay

## 2015-12-19 DIAGNOSIS — N189 Chronic kidney disease, unspecified: Secondary | ICD-10-CM | POA: Insufficient documentation

## 2015-12-19 DIAGNOSIS — I509 Heart failure, unspecified: Secondary | ICD-10-CM | POA: Diagnosis not present

## 2015-12-19 DIAGNOSIS — R011 Cardiac murmur, unspecified: Secondary | ICD-10-CM

## 2015-12-19 DIAGNOSIS — I059 Rheumatic mitral valve disease, unspecified: Secondary | ICD-10-CM | POA: Diagnosis not present

## 2015-12-19 DIAGNOSIS — I313 Pericardial effusion (noninflammatory): Secondary | ICD-10-CM | POA: Insufficient documentation

## 2015-12-19 DIAGNOSIS — I1310 Hypertensive heart and chronic kidney disease without heart failure, with stage 1 through stage 4 chronic kidney disease, or unspecified chronic kidney disease: Secondary | ICD-10-CM | POA: Insufficient documentation

## 2015-12-19 DIAGNOSIS — E1122 Type 2 diabetes mellitus with diabetic chronic kidney disease: Secondary | ICD-10-CM | POA: Diagnosis not present

## 2015-12-26 ENCOUNTER — Other Ambulatory Visit: Payer: Self-pay | Admitting: Surgery

## 2015-12-26 DIAGNOSIS — T82510D Breakdown (mechanical) of surgically created arteriovenous fistula, subsequent encounter: Secondary | ICD-10-CM

## 2016-01-01 ENCOUNTER — Encounter: Payer: Self-pay | Admitting: Surgery

## 2016-01-03 ENCOUNTER — Emergency Department (HOSPITAL_COMMUNITY): Payer: Medicare (Managed Care) | Admitting: Anesthesiology

## 2016-01-03 ENCOUNTER — Emergency Department (HOSPITAL_COMMUNITY): Payer: Medicare (Managed Care)

## 2016-01-03 ENCOUNTER — Inpatient Hospital Stay (HOSPITAL_COMMUNITY)
Admission: EM | Admit: 2016-01-03 | Discharge: 2016-01-13 | DRG: 853 | Disposition: E | Payer: Medicare (Managed Care) | Attending: General Surgery | Admitting: General Surgery

## 2016-01-03 ENCOUNTER — Encounter (HOSPITAL_COMMUNITY): Payer: Self-pay | Admitting: Emergency Medicine

## 2016-01-03 ENCOUNTER — Encounter (HOSPITAL_COMMUNITY): Admission: EM | Disposition: E | Payer: Self-pay | Source: Home / Self Care

## 2016-01-03 DIAGNOSIS — D62 Acute posthemorrhagic anemia: Secondary | ICD-10-CM | POA: Diagnosis not present

## 2016-01-03 DIAGNOSIS — A419 Sepsis, unspecified organism: Principal | ICD-10-CM | POA: Diagnosis present

## 2016-01-03 DIAGNOSIS — R570 Cardiogenic shock: Secondary | ICD-10-CM | POA: Diagnosis not present

## 2016-01-03 DIAGNOSIS — Z9289 Personal history of other medical treatment: Secondary | ICD-10-CM

## 2016-01-03 DIAGNOSIS — S270XXA Traumatic pneumothorax, initial encounter: Secondary | ICD-10-CM | POA: Diagnosis not present

## 2016-01-03 DIAGNOSIS — J9383 Other pneumothorax: Secondary | ICD-10-CM

## 2016-01-03 DIAGNOSIS — R6521 Severe sepsis with septic shock: Secondary | ICD-10-CM | POA: Diagnosis present

## 2016-01-03 DIAGNOSIS — I120 Hypertensive chronic kidney disease with stage 5 chronic kidney disease or end stage renal disease: Secondary | ICD-10-CM | POA: Diagnosis present

## 2016-01-03 DIAGNOSIS — I469 Cardiac arrest, cause unspecified: Secondary | ICD-10-CM | POA: Diagnosis not present

## 2016-01-03 DIAGNOSIS — I9581 Postprocedural hypotension: Secondary | ICD-10-CM | POA: Diagnosis not present

## 2016-01-03 DIAGNOSIS — K5660 Unspecified intestinal obstruction: Secondary | ICD-10-CM | POA: Diagnosis present

## 2016-01-03 DIAGNOSIS — E11649 Type 2 diabetes mellitus with hypoglycemia without coma: Secondary | ICD-10-CM | POA: Diagnosis present

## 2016-01-03 DIAGNOSIS — J95811 Postprocedural pneumothorax: Secondary | ICD-10-CM | POA: Diagnosis not present

## 2016-01-03 DIAGNOSIS — K7200 Acute and subacute hepatic failure without coma: Secondary | ICD-10-CM | POA: Diagnosis not present

## 2016-01-03 DIAGNOSIS — K56609 Unspecified intestinal obstruction, unspecified as to partial versus complete obstruction: Secondary | ICD-10-CM

## 2016-01-03 DIAGNOSIS — E1122 Type 2 diabetes mellitus with diabetic chronic kidney disease: Secondary | ICD-10-CM | POA: Diagnosis present

## 2016-01-03 DIAGNOSIS — J9601 Acute respiratory failure with hypoxia: Secondary | ICD-10-CM | POA: Diagnosis not present

## 2016-01-03 DIAGNOSIS — X58XXXA Exposure to other specified factors, initial encounter: Secondary | ICD-10-CM | POA: Diagnosis not present

## 2016-01-03 DIAGNOSIS — D631 Anemia in chronic kidney disease: Secondary | ICD-10-CM | POA: Diagnosis present

## 2016-01-03 DIAGNOSIS — N186 End stage renal disease: Secondary | ICD-10-CM | POA: Diagnosis present

## 2016-01-03 DIAGNOSIS — K55022 Diffuse acute infarction of small intestine: Secondary | ICD-10-CM | POA: Diagnosis present

## 2016-01-03 DIAGNOSIS — Z789 Other specified health status: Secondary | ICD-10-CM

## 2016-01-03 DIAGNOSIS — Y9389 Activity, other specified: Secondary | ICD-10-CM

## 2016-01-03 DIAGNOSIS — K567 Ileus, unspecified: Secondary | ICD-10-CM | POA: Diagnosis not present

## 2016-01-03 DIAGNOSIS — E039 Hypothyroidism, unspecified: Secondary | ICD-10-CM | POA: Diagnosis present

## 2016-01-03 DIAGNOSIS — F0390 Unspecified dementia without behavioral disturbance: Secondary | ICD-10-CM | POA: Diagnosis present

## 2016-01-03 DIAGNOSIS — Z66 Do not resuscitate: Secondary | ICD-10-CM | POA: Diagnosis not present

## 2016-01-03 DIAGNOSIS — Z515 Encounter for palliative care: Secondary | ICD-10-CM | POA: Diagnosis not present

## 2016-01-03 DIAGNOSIS — Z85038 Personal history of other malignant neoplasm of large intestine: Secondary | ICD-10-CM

## 2016-01-03 DIAGNOSIS — Z79899 Other long term (current) drug therapy: Secondary | ICD-10-CM

## 2016-01-03 DIAGNOSIS — R40243 Glasgow coma scale score 3-8, unspecified time: Secondary | ICD-10-CM | POA: Diagnosis not present

## 2016-01-03 DIAGNOSIS — J9600 Acute respiratory failure, unspecified whether with hypoxia or hypercapnia: Secondary | ICD-10-CM

## 2016-01-03 DIAGNOSIS — Z9911 Dependence on respirator [ventilator] status: Secondary | ICD-10-CM

## 2016-01-03 DIAGNOSIS — Y92230 Patient room in hospital as the place of occurrence of the external cause: Secondary | ICD-10-CM | POA: Diagnosis not present

## 2016-01-03 DIAGNOSIS — E874 Mixed disorder of acid-base balance: Secondary | ICD-10-CM | POA: Diagnosis present

## 2016-01-03 DIAGNOSIS — K668 Other specified disorders of peritoneum: Secondary | ICD-10-CM | POA: Diagnosis present

## 2016-01-03 DIAGNOSIS — Z992 Dependence on renal dialysis: Secondary | ICD-10-CM

## 2016-01-03 DIAGNOSIS — G9340 Encephalopathy, unspecified: Secondary | ICD-10-CM | POA: Diagnosis not present

## 2016-01-03 DIAGNOSIS — Z7982 Long term (current) use of aspirin: Secondary | ICD-10-CM

## 2016-01-03 DIAGNOSIS — E875 Hyperkalemia: Secondary | ICD-10-CM | POA: Diagnosis present

## 2016-01-03 DIAGNOSIS — R54 Age-related physical debility: Secondary | ICD-10-CM | POA: Diagnosis present

## 2016-01-03 DIAGNOSIS — Z01818 Encounter for other preprocedural examination: Secondary | ICD-10-CM

## 2016-01-03 DIAGNOSIS — Z7189 Other specified counseling: Secondary | ICD-10-CM

## 2016-01-03 DIAGNOSIS — K631 Perforation of intestine (nontraumatic): Secondary | ICD-10-CM | POA: Diagnosis present

## 2016-01-03 DIAGNOSIS — F259 Schizoaffective disorder, unspecified: Secondary | ICD-10-CM | POA: Diagnosis present

## 2016-01-03 HISTORY — PX: COLOSTOMY REVISION: SHX5232

## 2016-01-03 HISTORY — PX: ILEOSTOMY: SHX1783

## 2016-01-03 HISTORY — PX: LAPAROTOMY: SHX154

## 2016-01-03 HISTORY — DX: Unspecified dementia, unspecified severity, without behavioral disturbance, psychotic disturbance, mood disturbance, and anxiety: F03.90

## 2016-01-03 LAB — I-STAT VENOUS BLOOD GAS, ED
Acid-Base Excess: 5 mmol/L — ABNORMAL HIGH (ref 0.0–2.0)
Bicarbonate: 29.2 mEq/L — ABNORMAL HIGH (ref 20.0–24.0)
O2 Saturation: 86 %
PCO2 VEN: 40.9 mmHg — AB (ref 45.0–50.0)
PH VEN: 7.461 — AB (ref 7.250–7.300)
TCO2: 30 mmol/L (ref 0–100)
pO2, Ven: 48 mmHg — ABNORMAL HIGH (ref 31.0–45.0)

## 2016-01-03 LAB — CBC WITH DIFFERENTIAL/PLATELET
BASOS ABS: 0 10*3/uL (ref 0.0–0.1)
Band Neutrophils: 0 %
Basophils Relative: 0 %
Blasts: 0 %
Eosinophils Absolute: 0 10*3/uL (ref 0.0–0.7)
Eosinophils Relative: 0 %
HEMATOCRIT: 30.5 % — AB (ref 36.0–46.0)
HEMOGLOBIN: 9.7 g/dL — AB (ref 12.0–15.0)
LYMPHS ABS: 0.5 10*3/uL — AB (ref 0.7–4.0)
Lymphocytes Relative: 7 %
MCH: 28.1 pg (ref 26.0–34.0)
MCHC: 31.8 g/dL (ref 30.0–36.0)
MCV: 88.4 fL (ref 78.0–100.0)
METAMYELOCYTES PCT: 2 %
MYELOCYTES: 1 %
Monocytes Absolute: 0.2 10*3/uL (ref 0.1–1.0)
Monocytes Relative: 3 %
NEUTROS PCT: 87 %
NRBC: 0 /100{WBCs}
Neutro Abs: 6.6 10*3/uL (ref 1.7–7.7)
Other: 0 %
PROMYELOCYTES ABS: 0 %
Platelets: 147 10*3/uL — ABNORMAL LOW (ref 150–400)
RBC: 3.45 MIL/uL — AB (ref 3.87–5.11)
RDW: 17.4 % — ABNORMAL HIGH (ref 11.5–15.5)
WBC: 7.3 10*3/uL (ref 4.0–10.5)

## 2016-01-03 LAB — I-STAT CHEM 8, ED
BUN: 57 mg/dL — AB (ref 6–20)
CALCIUM ION: 0.99 mmol/L — AB (ref 1.12–1.23)
CHLORIDE: 96 mmol/L — AB (ref 101–111)
Creatinine, Ser: 4.9 mg/dL — ABNORMAL HIGH (ref 0.44–1.00)
GLUCOSE: 98 mg/dL (ref 65–99)
HCT: 34 % — ABNORMAL LOW (ref 36.0–46.0)
Hemoglobin: 11.6 g/dL — ABNORMAL LOW (ref 12.0–15.0)
Potassium: 5.7 mmol/L — ABNORMAL HIGH (ref 3.5–5.1)
SODIUM: 135 mmol/L (ref 135–145)
TCO2: 27 mmol/L (ref 0–100)

## 2016-01-03 LAB — URINALYSIS, ROUTINE W REFLEX MICROSCOPIC
Glucose, UA: NEGATIVE mg/dL
KETONES UR: NEGATIVE mg/dL
NITRITE: NEGATIVE
PH: 6 (ref 5.0–8.0)
Protein, ur: >300 mg/dL — AB
SPECIFIC GRAVITY, URINE: 1.025 (ref 1.005–1.030)

## 2016-01-03 LAB — COMPREHENSIVE METABOLIC PANEL
ALK PHOS: 39 U/L (ref 38–126)
ALT: 39 U/L (ref 14–54)
ANION GAP: 19 — AB (ref 5–15)
AST: 50 U/L — ABNORMAL HIGH (ref 15–41)
Albumin: 3.8 g/dL (ref 3.5–5.0)
BILIRUBIN TOTAL: 0.6 mg/dL (ref 0.3–1.2)
BUN: 44 mg/dL — ABNORMAL HIGH (ref 6–20)
CALCIUM: 9.5 mg/dL (ref 8.9–10.3)
CO2: 24 mmol/L (ref 22–32)
CREATININE: 4.6 mg/dL — AB (ref 0.44–1.00)
Chloride: 96 mmol/L — ABNORMAL LOW (ref 101–111)
GFR, EST AFRICAN AMERICAN: 10 mL/min — AB (ref >60)
GFR, EST NON AFRICAN AMERICAN: 8 mL/min — AB (ref >60)
Glucose, Bld: 110 mg/dL — ABNORMAL HIGH (ref 65–99)
Potassium: 5.3 mmol/L — ABNORMAL HIGH (ref 3.5–5.1)
SODIUM: 139 mmol/L (ref 135–145)
TOTAL PROTEIN: 6.7 g/dL (ref 6.5–8.1)

## 2016-01-03 LAB — URINE MICROSCOPIC-ADD ON: Bacteria, UA: NONE SEEN

## 2016-01-03 LAB — POC OCCULT BLOOD, ED: Fecal Occult Bld: NEGATIVE

## 2016-01-03 LAB — I-STAT CG4 LACTIC ACID, ED
LACTIC ACID, VENOUS: 8.25 mmol/L — AB (ref 0.5–1.9)
LACTIC ACID, VENOUS: 4.43 mmol/L — AB (ref 0.5–1.9)

## 2016-01-03 LAB — LIPASE, BLOOD: LIPASE: 23 U/L (ref 11–51)

## 2016-01-03 SURGERY — LAPAROTOMY, EXPLORATORY
Anesthesia: General | Site: Abdomen | Laterality: Right

## 2016-01-03 MED ORDER — ASPIRIN 81 MG PO CHEW
324.0000 mg | CHEWABLE_TABLET | Freq: Once | ORAL | Status: AC
  Administered 2016-01-03: 324 mg via ORAL
  Filled 2016-01-03: qty 4

## 2016-01-03 MED ORDER — SODIUM CHLORIDE 0.9 % IJ SOLN
INTRAMUSCULAR | Status: AC
  Filled 2016-01-03: qty 10

## 2016-01-03 MED ORDER — ROCURONIUM BROMIDE 100 MG/10ML IV SOLN
INTRAVENOUS | Status: DC | PRN
  Administered 2016-01-03: 50 mg via INTRAVENOUS

## 2016-01-03 MED ORDER — SODIUM BICARBONATE 8.4 % IV SOLN
INTRAVENOUS | Status: AC
  Filled 2016-01-03: qty 50

## 2016-01-03 MED ORDER — ETOMIDATE 2 MG/ML IV SOLN
INTRAVENOUS | Status: DC | PRN
  Administered 2016-01-03: 16 mg via INTRAVENOUS

## 2016-01-03 MED ORDER — LIDOCAINE HCL (CARDIAC) 20 MG/ML IV SOLN
INTRAVENOUS | Status: DC | PRN
  Administered 2016-01-03: 80 mg via INTRATRACHEAL

## 2016-01-03 MED ORDER — DEXTROSE 50 % IV SOLN
INTRAVENOUS | Status: DC | PRN
  Administered 2016-01-03: 12.5 g via INTRAVENOUS

## 2016-01-03 MED ORDER — VANCOMYCIN HCL IN DEXTROSE 1-5 GM/200ML-% IV SOLN
1000.0000 mg | Freq: Once | INTRAVENOUS | Status: AC
  Administered 2016-01-03: 1000 mg via INTRAVENOUS
  Filled 2016-01-03: qty 200

## 2016-01-03 MED ORDER — ETOMIDATE 2 MG/ML IV SOLN
INTRAVENOUS | Status: AC
  Filled 2016-01-03: qty 10

## 2016-01-03 MED ORDER — SUCCINYLCHOLINE CHLORIDE 200 MG/10ML IV SOSY
PREFILLED_SYRINGE | INTRAVENOUS | Status: AC
  Filled 2016-01-03: qty 10

## 2016-01-03 MED ORDER — PROPOFOL 10 MG/ML IV BOLUS
INTRAVENOUS | Status: AC
  Filled 2016-01-03: qty 20

## 2016-01-03 MED ORDER — INSULIN ASPART 100 UNIT/ML ~~LOC~~ SOLN
SUBCUTANEOUS | Status: AC
  Filled 2016-01-03: qty 5

## 2016-01-03 MED ORDER — VANCOMYCIN HCL 500 MG IV SOLR
500.0000 mg | Freq: Once | INTRAVENOUS | Status: AC
  Administered 2016-01-03: 500 mg via INTRAVENOUS
  Filled 2016-01-03: qty 500

## 2016-01-03 MED ORDER — ALBUMIN HUMAN 5 % IV SOLN
INTRAVENOUS | Status: DC | PRN
  Administered 2016-01-03: 22:00:00 via INTRAVENOUS

## 2016-01-03 MED ORDER — ONDANSETRON HCL 4 MG/2ML IJ SOLN
4.0000 mg | Freq: Once | INTRAMUSCULAR | Status: AC
  Administered 2016-01-03: 4 mg via INTRAVENOUS
  Filled 2016-01-03: qty 2

## 2016-01-03 MED ORDER — 0.9 % SODIUM CHLORIDE (POUR BTL) OPTIME
TOPICAL | Status: DC | PRN
  Administered 2016-01-03: 1000 mL

## 2016-01-03 MED ORDER — INSULIN ASPART 100 UNIT/ML ~~LOC~~ SOLN
SUBCUTANEOUS | Status: DC | PRN
  Administered 2016-01-03: 10 [IU] via SUBCUTANEOUS

## 2016-01-03 MED ORDER — SODIUM BICARBONATE 8.4 % IV SOLN
INTRAVENOUS | Status: DC | PRN
  Administered 2016-01-03: 25 meq via INTRAVENOUS

## 2016-01-03 MED ORDER — SUFENTANIL CITRATE 50 MCG/ML IV SOLN
INTRAVENOUS | Status: AC
  Filled 2016-01-03: qty 1

## 2016-01-03 MED ORDER — PHENYLEPHRINE HCL 10 MG/ML IJ SOLN
10.0000 mg | INTRAVENOUS | Status: DC | PRN
  Administered 2016-01-03: 15 ug/min via INTRAVENOUS

## 2016-01-03 MED ORDER — SODIUM CHLORIDE 0.9 % IV SOLN
INTRAVENOUS | Status: DC | PRN
  Administered 2016-01-03 – 2016-01-04 (×2): via INTRAVENOUS

## 2016-01-03 MED ORDER — ONDANSETRON 4 MG PO TBDP: 4.0000 mg | ORAL_TABLET | Freq: Once | ORAL | Status: DC

## 2016-01-03 MED ORDER — SODIUM CHLORIDE 0.9 % IV BOLUS (SEPSIS)
1000.0000 mL | Freq: Once | INTRAVENOUS | Status: AC
  Administered 2016-01-03: 1000 mL via INTRAVENOUS

## 2016-01-03 MED ORDER — ROCURONIUM BROMIDE 50 MG/5ML IV SOLN
INTRAVENOUS | Status: AC
  Filled 2016-01-03: qty 1

## 2016-01-03 MED ORDER — SUFENTANIL CITRATE 50 MCG/ML IV SOLN
INTRAVENOUS | Status: DC | PRN
  Administered 2016-01-03 (×4): 5 ug via INTRAVENOUS

## 2016-01-03 MED ORDER — PIPERACILLIN-TAZOBACTAM 3.375 G IVPB 30 MIN
3.3750 g | Freq: Once | INTRAVENOUS | Status: AC
  Administered 2016-01-03: 3.375 g via INTRAVENOUS
  Filled 2016-01-03: qty 50

## 2016-01-03 MED ORDER — SODIUM CHLORIDE 0.9 % IV BOLUS (SEPSIS)
250.0000 mL | Freq: Once | INTRAVENOUS | Status: AC
  Administered 2016-01-03: 250 mL via INTRAVENOUS

## 2016-01-03 MED ORDER — IOPAMIDOL (ISOVUE-370) INJECTION 76%
INTRAVENOUS | Status: AC
  Filled 2016-01-03: qty 100

## 2016-01-03 MED ORDER — MIDAZOLAM HCL 2 MG/2ML IJ SOLN
INTRAMUSCULAR | Status: AC
  Filled 2016-01-03: qty 2

## 2016-01-03 MED ORDER — SUCCINYLCHOLINE CHLORIDE 20 MG/ML IJ SOLN
INTRAMUSCULAR | Status: DC | PRN
  Administered 2016-01-03: 80 mg via INTRAVENOUS

## 2016-01-03 MED ORDER — MORPHINE SULFATE (PF) 4 MG/ML IV SOLN
4.0000 mg | Freq: Once | INTRAVENOUS | Status: AC
  Administered 2016-01-03: 4 mg via INTRAVENOUS
  Filled 2016-01-03: qty 1

## 2016-01-03 MED ORDER — ONDANSETRON HCL 4 MG/2ML IJ SOLN
INTRAMUSCULAR | Status: AC
  Filled 2016-01-03: qty 2

## 2016-01-03 MED ORDER — DEXTROSE 50 % IV SOLN
INTRAVENOUS | Status: AC
  Filled 2016-01-03: qty 50

## 2016-01-03 MED ORDER — PIPERACILLIN-TAZOBACTAM IN DEX 2-0.25 GM/50ML IV SOLN
2.2500 g | Freq: Three times a day (TID) | INTRAVENOUS | Status: DC
  Administered 2016-01-03 – 2016-01-06 (×8): 2.25 g via INTRAVENOUS
  Filled 2016-01-03 (×12): qty 50

## 2016-01-03 SURGICAL SUPPLY — 48 items
BNDG GAUZE ELAST 4 BULKY (GAUZE/BANDAGES/DRESSINGS) ×4 IMPLANT
CANISTER SUCTION 2500CC (MISCELLANEOUS) ×4 IMPLANT
CHLORAPREP W/TINT 26ML (MISCELLANEOUS) ×4 IMPLANT
COVER SURGICAL LIGHT HANDLE (MISCELLANEOUS) ×4 IMPLANT
DRAPE LAPAROSCOPIC ABDOMINAL (DRAPES) ×4 IMPLANT
DRAPE WARM FLUID 44X44 (DRAPE) ×4 IMPLANT
DRSG OPSITE POSTOP 4X10 (GAUZE/BANDAGES/DRESSINGS) IMPLANT
DRSG OPSITE POSTOP 4X8 (GAUZE/BANDAGES/DRESSINGS) IMPLANT
ELECT BLADE 6.5 EXT (BLADE) IMPLANT
ELECT CAUTERY BLADE 6.4 (BLADE) ×4 IMPLANT
ELECT REM PT RETURN 9FT ADLT (ELECTROSURGICAL) ×4
ELECTRODE REM PT RTRN 9FT ADLT (ELECTROSURGICAL) ×2 IMPLANT
GLOVE BIO SURGEON STRL SZ7.5 (GLOVE) ×4 IMPLANT
GLOVE BIOGEL PI IND STRL 8 (GLOVE) ×2 IMPLANT
GLOVE BIOGEL PI INDICATOR 8 (GLOVE) ×2
GOWN STRL REUS W/ TWL LRG LVL3 (GOWN DISPOSABLE) ×2 IMPLANT
GOWN STRL REUS W/ TWL XL LVL3 (GOWN DISPOSABLE) ×2 IMPLANT
GOWN STRL REUS W/TWL LRG LVL3 (GOWN DISPOSABLE) ×2
GOWN STRL REUS W/TWL XL LVL3 (GOWN DISPOSABLE) ×2
KIT BASIN OR (CUSTOM PROCEDURE TRAY) ×4 IMPLANT
KIT OSTOMY DRAINABLE 2.75 STR (WOUND CARE) ×4 IMPLANT
KIT ROOM TURNOVER OR (KITS) ×4 IMPLANT
LIGASURE IMPACT 36 18CM CVD LR (INSTRUMENTS) ×4 IMPLANT
NS IRRIG 1000ML POUR BTL (IV SOLUTION) ×28 IMPLANT
PACK GENERAL/GYN (CUSTOM PROCEDURE TRAY) ×4 IMPLANT
PAD ABD 8X10 STRL (GAUZE/BANDAGES/DRESSINGS) ×4 IMPLANT
PAD ARMBOARD 7.5X6 YLW CONV (MISCELLANEOUS) ×4 IMPLANT
RELOAD PROXIMATE 75MM BLUE (ENDOMECHANICALS) ×4 IMPLANT
RETAINER VISCERA MED (MISCELLANEOUS) ×4 IMPLANT
SPECIMEN JAR LARGE (MISCELLANEOUS) IMPLANT
SPONGE GAUZE 4X4 12PLY STER LF (GAUZE/BANDAGES/DRESSINGS) ×4 IMPLANT
SPONGE LAP 18X18 X RAY DECT (DISPOSABLE) ×4 IMPLANT
STAPLER PROXIMATE 75MM BLUE (STAPLE) ×4 IMPLANT
STAPLER VISISTAT 35W (STAPLE) ×4 IMPLANT
SUCTION POOLE TIP (SUCTIONS) ×4 IMPLANT
SUT NOVA NAB GS-21 0 18 T12 DT (SUTURE) ×8 IMPLANT
SUT PDS AB 1 TP1 96 (SUTURE) ×8 IMPLANT
SUT SILK 2 0 SH CR/8 (SUTURE) ×4 IMPLANT
SUT SILK 2 0 TIES 10X30 (SUTURE) ×4 IMPLANT
SUT SILK 3 0 SH CR/8 (SUTURE) ×8 IMPLANT
SUT SILK 3 0 TIES 10X30 (SUTURE) ×4 IMPLANT
SUT VIC AB 3-0 SH 18 (SUTURE) ×4 IMPLANT
TAPE CLOTH SURG 4X10 WHT LF (GAUZE/BANDAGES/DRESSINGS) ×4 IMPLANT
TAPE CLOTH SURG 6X10 WHT LF (GAUZE/BANDAGES/DRESSINGS) ×4 IMPLANT
TOWEL OR 17X24 6PK STRL BLUE (TOWEL DISPOSABLE) ×4 IMPLANT
TOWEL OR 17X26 10 PK STRL BLUE (TOWEL DISPOSABLE) ×4 IMPLANT
TRAY FOLEY CATH 14FR (SET/KITS/TRAYS/PACK) ×4 IMPLANT
YANKAUER SUCT BULB TIP NO VENT (SUCTIONS) IMPLANT

## 2016-01-03 NOTE — ED Provider Notes (Signed)
CSN: [id number]     Arrival date & time 01/06/2016  1431 History   First MD Initiated Contact with Patient 01/02/2016 1507     Chief Complaint  Patient presents with  . Abdominal Pain     (Consider location/radiation/quality/duration/timing/severity/associated sxs/prior Treatment) HPI Comments: 81yo F w/ PMH including ESRD on HD T/Th/Sat, HTN, colon CA who p/w abdominal pain. Pt had dialysis 2 days ago and afterwards began having abdominal pain which is located in her lower abdomen. She had a large, non-bloody BM that same day which was normal. Yesterday her abdominal pain continued and she had a few loose stools. Last BM was this morning and she reports rectal pain during BM. No h/o constipation or hemorrhoids. Her abdominal pain worsened during dialysis today and was sent here from dialysis clinic after ~1hr of dialysis. She has had some vomiting since 2 days ago and ongoing nausea. Currently, her pain is worst in RLQ and is constant, 9/10 in intensity. Mild cough which is chronic. No chest pain or shortness of breath. She states she is breathing quickly because the pain is so bad. She does still produce urine and denies any urinary symptoms.   Patient is a 81 y.o. female presenting with abdominal pain. The history is provided by the patient.  Abdominal Pain   Past Medical History  Diagnosis Date  . Hypertension   . Diabetes mellitus without complication ([place])   . Chronic kidney disease   . Cancer [facility])     Colon  . Thyroid disease    History reviewed. No pertinent past surgical history. History reviewed. No pertinent family history. Social History  Substance Use Topics  . Smoking status: Never Smoker   . Smokeless tobacco: Never Used  . Alcohol Use: No   OB History    No data available     Review of Systems  Gastrointestinal: Positive for abdominal pain.   10 Systems reviewed and are negative for acute change except as noted in the HPI.    Allergies  Review of patient's  allergies indicates no known allergies.  Home Medications   Prior to Admission medications   Medication Sig Start Date End Date Taking? Authorizing Provider  acetaminophen (TYLENOL) 325 MG tablet Take 650 mg by mouth every 4 (four) hours as needed.   Yes Historical Provider, MD  allopurinol (ZYLOPRIM) 100 MG tablet Take 100 mg by mouth daily.   Yes Historical Provider, MD  Amino Acids-Protein Hydrolys (PRO-STAT PO) Take 30 mLs by mouth 3 (three) times daily.   Yes Historical Provider, MD  amLODipine (NORVASC) 10 MG tablet Take 10 mg by mouth daily.   Yes Historical Provider, MD  aspirin 81 MG tablet Take 81 mg by mouth daily.   Yes Historical Provider, MD  cloNIDine (CATAPRES) 0.2 MG tablet Take 1 tablet (0.2 mg total) by mouth 3 (three) times daily. 10/17/15  Yes Binnie Rail, MD  hydrALAZINE (APRESOLINE) 100 MG tablet Take 100 mg by mouth 2 (two) times daily.   Yes Historical Provider, MD  isosorbide dinitrate (ISORDIL) 20 MG tablet Take 20 mg by mouth 3 (three) times daily.   Yes Historical Provider, MD  levothyroxine (SYNTHROID, LEVOTHROID) 25 MCG tablet Take 25 mcg by mouth daily before breakfast.   Yes Historical Provider, MD  linagliptin (TRADJENTA) 5 MG TABS tablet Take 5 mg by mouth daily.   Yes Historical Provider, MD  lisinopril (PRINIVIL,ZESTRIL) 20 MG tablet Take 20 mg by mouth daily.   Yes Historical Provider, MD  multivitamin (RENA-VIT) TABS tablet Take 1 tablet by mouth daily. 11/20/15  Yes Historical Provider, MD  sevelamer carbonate (RENVELA) 800 MG tablet Take 800 mg by mouth. Take 5 tablets with each meal and 3 tablets with each snack   Yes Historical Provider, MD  vitamin B-12 (CYANOCOBALAMIN) 1000 MCG tablet Take 1 tablet (1,000 mcg total) by mouth daily. 10/17/15  Yes Binnie Rail, MD   BP 190/58 mmHg  Pulse 99  Temp(Src) 98.8 F (37.1 C) (Rectal)  Resp 38  Wt 143 lb 5 oz (65.006 kg)  SpO2 92% Physical Exam  Constitutional: She is oriented to person, place, and time.  She appears well-developed and well-nourished. She appears distressed.  Laying on R side, in mild distress due to pain  HENT:  Head: Normocephalic and atraumatic.  Dry mucous membranes  Eyes: Conjunctivae are normal. Pupils are equal, round, and reactive to light.  Neck: Neck supple.  Cardiovascular: Regular rhythm and normal heart sounds.  Tachycardia present.   No murmur heard. Pulmonary/Chest: Breath sounds normal.  Tachypnea, shallow breathing due to pain however no crackles or rhonchi  Abdominal: Soft. She exhibits no distension. There is tenderness.  ttp across lower abd including RLQ and LLQ, large hernia in right lower quadrant near surgical scar  Musculoskeletal: She exhibits no edema.  Neurological: She is alert and oriented to person, place, and time.  Fluent speech  Skin: Skin is warm and dry.  Psychiatric:  uncomfortable  Nursing note and vitals reviewed.   ED Course  .Critical Care Performed by: Sharlett Iles Authorized by: Sharlett Iles Total critical care time: 60 minutes Critical care time was exclusive of separately billable procedures and treating other patients. Critical care was necessary to treat or prevent imminent or life-threatening deterioration of the following conditions: perforated bowel. Critical care was time spent personally by me on the following activities: development of treatment plan with patient or surrogate, discussions with consultants, evaluation of patient's response to treatment, examination of patient, obtaining history from patient or surrogate, ordering and performing treatments and interventions, ordering and review of laboratory studies, ordering and review of radiographic studies, re-evaluation of patient's condition and review of old charts.   (including critical care time) Labs Review Labs Reviewed  COMPREHENSIVE METABOLIC PANEL - Abnormal; Notable for the following:    Potassium 5.3 (*)    Chloride 96 (*)     Glucose, Bld 110 (*)    BUN 44 (*)    Creatinine, Ser 4.60 (*)    AST 50 (*)    GFR calc non Af Amer 8 (*)    GFR calc Af Amer 10 (*)    Anion gap 19 (*)    All other components within normal limits  CBC WITH DIFFERENTIAL/PLATELET - Abnormal; Notable for the following:    RBC 3.45 (*)    Hemoglobin 9.7 (*)    HCT 30.5 (*)    RDW 17.4 (*)    Platelets 147 (*)    Lymphs Abs 0.5 (*)    All other components within normal limits  URINALYSIS, ROUTINE W REFLEX MICROSCOPIC (NOT AT [facility]) - Abnormal; Notable for the following:    Hgb urine dipstick LARGE (*)    Bilirubin Urine SMALL (*)    Protein, ur >300 (*)    Leukocytes, UA TRACE (*)    All other components within normal limits  URINE MICROSCOPIC-ADD ON - Abnormal; Notable for the following:    Squamous Epithelial / LPF 0-5 (*)  All other components within normal limits  I-STAT CG4 LACTIC ACID, ED - Abnormal; Notable for the following:    Lactic Acid, Venous 8.25 (*)    All other components within normal limits  I-STAT CHEM 8, ED - Abnormal; Notable for the following:    Potassium 5.7 (*)    Chloride 96 (*)    BUN 57 (*)    Creatinine, Ser 4.90 (*)    Calcium, Ion 0.99 (*)    Hemoglobin 11.6 (*)    HCT 34.0 (*)    All other components within normal limits  I-STAT CG4 LACTIC ACID, ED - Abnormal; Notable for the following:    Lactic Acid, Venous 4.43 (*)    All other components within normal limits  I-STAT VENOUS BLOOD GAS, ED - Abnormal; Notable for the following:    pH, Ven 7.461 (*)    pCO2, Ven 40.9 (*)    pO2, Ven 48.0 (*)    Bicarbonate 29.2 (*)    Acid-Base Excess 5.0 (*)    All other components within normal limits  URINE CULTURE  CULTURE, BLOOD (ROUTINE X 2)  CULTURE, BLOOD (ROUTINE X 2)  LIPASE, BLOOD  BLOOD GAS, VENOUS  POC OCCULT BLOOD, ED    Imaging Review Ct Abdomen Pelvis Wo Contrast  12/26/2015  CLINICAL DATA:  Lower abdominal pain and lactic acidosis. End-stage renal disease. History of colon  cancer. EXAM: CT ABDOMEN AND PELVIS WITHOUT CONTRAST TECHNIQUE: Multidetector CT imaging of the abdomen and pelvis was performed following the standard protocol without IV contrast. COMPARISON:  None. FINDINGS: There is a small right effusion. Mild bibasilar atelectasis. No other acute abnormalities in the lower chest. There is free air throughout the abdomen. The colon is normal in caliber and much smaller than the small bowel. The stomach is normal in appearance. The small bowel loops are diffusely distended. Two potential transition points are identified. First, there is a hernia in the right anterior lateral abdomen as seen on axial image 56. There appears to be an abrupt transition point between the dilated loops of small bowel entering the hernia and a nondilated loop of bowel leaving the hernia. I suspect this is the cause of the small bowel obstruction. There is also an a tubular masslike process in the right lower quadrant, just inferior to the anastomosis best seen on axial images 50 through 61. It is possible that this is a mass. However, I suspect it is more likely to represent abnormal small bowel loops with wall thickening. There is ascites throughout the abdomen. An oval region of low attenuation projected over the right hepatic lobe on axial image 27 is thought to be an abnormal gallbladder containing debris/ sludge with wall thickening and pericholecystic fluid. The remainder of the liver is normal. The spleen is unremarkable. Probable adrenal hyperplasia. Small kidneys consistent with end-stage renal disease. The pancreas is normal. Atherosclerosis is seen in the abdominal aorta. No obvious adenopathy although evaluation is limited. The pelvis demonstrates no adenopathy or mass. The bladder is decompressed. No masses noted. No evidence of bony metastatic disease. No acute bony abnormalities. IMPRESSION: 1. There is free air in the abdomen consistent with bowel perforation. 2. There is a small bowel  obstruction. Evaluation for a transition point is difficult given the lack of intra-abdominal fat and contrast. Two potential sites of transition are identified. The first is the right anterior lateral hernia. The loop of bowel exiting the hernia appears to be relatively decompressed compared to the more proximal dilated  bowel. Additionally, there is a tubular abnormality in the right lower quadrant described above. This is favored to represent abnormal loops of bowel with wall thickening. This is another persist potential site of obstruction. This abnormality is thought to be less likely to represent a mass. 3. The gallbladder is abnormal in appearance with possible sludge, wall thickening, and pericholecystic fluid. An ultrasound could better evaluate. The findings were called to Dr. Rex Kras Electronically Signed   By: Dorise Bullion III M.D   On: 12/17/2015 19:32   Dg Chest Port 1 View  12/22/2015  CLINICAL DATA:  Tachypnea, abdominal pain, abnormal EKG today. History of hypertension, diabetes, chronic kidney disease. EXAM: PORTABLE CHEST 1 VIEW COMPARISON:  None. FINDINGS: Heart size is upper normal. Atherosclerotic changes noted at the aortic arch. Study is hypoinspiratory with crowding of the perihilar bronchovascular markings. Elevated right hemidiaphragm obscures visualization of the right lung base. Visualized portions of the lungs are clear. No evidence of pneumonia. No pleural effusion or pneumothorax seen. Osseous structures about the chest are unremarkable. Dialysis catheter appears adequately positioned with tip at the level of the mid SVC. IMPRESSION: 1. Low lung volumes. No acute findings. No evidence of pneumonia. No evidence of CHF. 2. Aortic atherosclerosis. Electronically Signed   By: Franki Cabot M.D.   On: 12/16/2015 16:39   I have personally reviewed and evaluated these lab results as part of my medical decision-making.   EKG Interpretation   Date/Time:  Saturday January 03 2016  16:04:13 EDT Ventricular Rate:  118 PR Interval:    QRS Duration: 102 QT Interval:  358 QTC Calculation: 487 R Axis:   3 Text Interpretation:  Atrial-paced complexes LVH with secondary  repolarization abnormality Borderline prolonged QT interval peaked T waves  slightly more pronounced, otherwise similar to previous tracing from today  Confirmed by LITTLE MD, RACHEL [phone]) on 01/05/2016 5:30:05 PM     Medications  piperacillin-tazobactam (ZOSYN) IVPB 2.25 g (not administered)  iopamidol (ISOVUE-370) 76 % injection (not administered)  morphine 4 MG/ML injection 4 mg (4 mg Intravenous Given 12/23/2015 1550)  ondansetron (ZOFRAN) injection 4 mg (4 mg Intravenous Given 01/08/2016 1550)  aspirin chewable tablet 324 mg (324 mg Oral Given 12/26/2015 1609)  sodium chloride 0.9 % bolus 1,000 mL (0 mLs Intravenous Stopped 01/02/2016 1748)    And  sodium chloride 0.9 % bolus 1,000 mL (0 mLs Intravenous Stopped 12/22/2015 1748)    And  sodium chloride 0.9 % bolus 250 mL (0 mLs Intravenous Stopped 01/12/2016 1715)  piperacillin-tazobactam (ZOSYN) IVPB 3.375 g (0 g Intravenous Stopped 12/26/2015 1730)  vancomycin (VANCOCIN) IVPB 1000 mg/200 mL premix (0 mg Intravenous Stopped 12/25/2015 1825)  vancomycin (VANCOCIN) 500 mg in sodium chloride 0.9 % 100 mL IVPB (0 mg Intravenous Stopped 12/24/2015 1835)    MDM   Final diagnoses:  SBO (small bowel obstruction) ([place])  Bowel perforation ([place])   Patient presents with several days of abdominal pain, worse during dialysis today which is why she presented. She was uncomfortable and in mild distress on exam. Mild tachycardia and tachypnea, normal O2 saturation, mild hypertension on exam. She had tenderness of her right lower quadrant and left lower quadrant with noticeable right lower quadrant hernia. Initial lactate was elevated at 8.25. Initiated a sepsis protocol with IV fluids, vancomycin, Zosyn, cultures. Labs showed normal WBC count, hemoglobin 9.7, normal potassium, normal  troponin. EKG did show T-wave inversions that are new from previous, no ST elevation. Patient received morphine and Zofran. Obtained CT which showed  free air in abdomen consistent with bowel perforation. Evidence of small bowel obstruction, difficult to ascertain transition point. Immediately contacted general surgery and discussed with Dr. Rosendo Gros, who will take the patient to the OR. Discussed with critical care, Dr. Corinna Lines, who was in agreement with current treatment plan and will add patient to critical care list. Patient taken to OR for operative management.  Sharlett Iles, MD 01/04/16 [phone]

## 2016-01-03 NOTE — Consult Note (Signed)
Shelly Cain is an 81 y.o. female.   Chief Complaint: Abdominal pain HPI: Patient is a 81 year old female with a history of dementia/end-stage renal disease on hemodialysis/hypertension/diabetes, previous history of what appears to be either a right colectomy versus colectomy with anastomosis. Patient is two-day history of abdominal pain mainly located in the right lower quadrant over her current incisional hernia. Patient currently lives with her son.  Patient does state that she was at dialysis today and only underwent approximately one hour dialysis. She undergoes dialysis Tuesday Thursday and Friday. According to the patient's son the patient sees Dr. Vickey Sages  Upon presentation to the ER patient that patient would laboratory studies as well as CT scan. Laboratory studies revealed an elevated lactate level, potassium, creatinine. Patient with CT scan which revealed free air, likely bowel obstruction, incisional hernia, and difficulty in pinpoint area of perforation.  Past Medical History  Diagnosis Date  . Hypertension   . Diabetes mellitus without complication ([place])   . Chronic kidney disease   . Cancer [facility])     Colon  . Thyroid disease     History reviewed. No pertinent past surgical history.  History reviewed. No pertinent family history. Social History:  reports that she has never smoked. She has never used smokeless tobacco. She reports that she does not drink alcohol or use illicit drugs.  Allergies: No Known Allergies   (Not in a hospital admission)  Results for orders placed or performed during the hospital encounter of 01/06/2016 (from the past 48 hour(s))  Comprehensive metabolic panel     Status: Abnormal   Collection Time: 12/17/2015  3:44 PM  Result Value Ref Range   Sodium 139 135 - 145 mmol/L   Potassium 5.3 (H) 3.5 - 5.1 mmol/L   Chloride 96 (L) 101 - 111 mmol/L   CO2 24 22 - 32 mmol/L   Glucose, Bld 110 (H) 65 - 99 mg/dL   BUN 44 (H) 6 - 20 mg/dL    Creatinine, Ser 4.60 (H) 0.44 - 1.00 mg/dL   Calcium 9.5 8.9 - 10.3 mg/dL   Total Protein 6.7 6.5 - 8.1 g/dL   Albumin 3.8 3.5 - 5.0 g/dL   AST 50 (H) 15 - 41 U/L   ALT 39 14 - 54 U/L   Alkaline Phosphatase 39 38 - 126 U/L   Total Bilirubin 0.6 0.3 - 1.2 mg/dL   GFR calc non Af Amer 8 (L) >60 mL/min   GFR calc Af Amer 10 (L) >60 mL/min    Comment: (NOTE) The eGFR has been calculated using the CKD EPI equation. This calculation has not been validated in all clinical situations. eGFR's persistently <60 mL/min signify possible Chronic Kidney Disease.    Anion gap 19 (H) 5 - 15  Lipase, blood     Status: None   Collection Time: 12/21/2015  3:44 PM  Result Value Ref Range   Lipase 23 11 - 51 U/L  CBC with Differential     Status: Abnormal   Collection Time: 12/19/2015  3:44 PM  Result Value Ref Range   WBC 7.3 4.0 - 10.5 K/uL   RBC 3.45 (L) 3.87 - 5.11 MIL/uL   Hemoglobin 9.7 (L) 12.0 - 15.0 g/dL   HCT 30.5 (L) 36.0 - 46.0 %   MCV 88.4 78.0 - 100.0 fL   MCH 28.1 26.0 - 34.0 pg   MCHC 31.8 30.0 - 36.0 g/dL   RDW 17.4 (H) 11.5 - 15.5 %   Platelets 147 (L) 150 -  400 K/uL   Neutrophils Relative % 87 %   Lymphocytes Relative 7 %   Monocytes Relative 3 %   Eosinophils Relative 0 %   Basophils Relative 0 %   Band Neutrophils 0 %   Metamyelocytes Relative 2 %   Myelocytes 1 %   Promyelocytes Absolute 0 %   Blasts 0 %   nRBC 0 0 /100 WBC   Other 0 %   Neutro Abs 6.6 1.7 - 7.7 K/uL   Lymphs Abs 0.5 (L) 0.7 - 4.0 K/uL   Monocytes Absolute 0.2 0.1 - 1.0 K/uL   Eosinophils Absolute 0.0 0.0 - 0.7 K/uL   Basophils Absolute 0.0 0.0 - 0.1 K/uL  POC occult blood, ED Provider will collect     Status: None   Collection Time: 12/31/2015  3:48 PM  Result Value Ref Range   Fecal Occult Bld NEGATIVE NEGATIVE  I-Stat CG4 Lactic Acid, ED     Status: Abnormal   Collection Time: 12/14/2015  3:52 PM  Result Value Ref Range   Lactic Acid, Venous 8.25 (HH) 0.5 - 1.9 mmol/L   Comment NOTIFIED PHYSICIAN    Urinalysis, Routine w reflex microscopic     Status: Abnormal   Collection Time: 12/30/2015  4:23 PM  Result Value Ref Range   Color, Urine YELLOW YELLOW   APPearance CLEAR CLEAR   Specific Gravity, Urine 1.025 1.005 - 1.030   pH 6.0 5.0 - 8.0   Glucose, UA NEGATIVE NEGATIVE mg/dL   Hgb urine dipstick LARGE (A) NEGATIVE   Bilirubin Urine SMALL (A) NEGATIVE   Ketones, ur NEGATIVE NEGATIVE mg/dL   Protein, ur >300 (A) NEGATIVE mg/dL   Nitrite NEGATIVE NEGATIVE   Leukocytes, UA TRACE (A) NEGATIVE  Urine microscopic-add on     Status: Abnormal   Collection Time: 12/28/2015  4:23 PM  Result Value Ref Range   Squamous Epithelial / LPF 0-5 (A) NONE SEEN   WBC, UA 0-5 0 - 5 WBC/hpf   RBC / HPF 0-5 0 - 5 RBC/hpf   Bacteria, UA NONE SEEN NONE SEEN   Urine-Other MICROSCOPIC EXAM PERFORMED ON UNCONCENTRATED URINE   I-stat Chem 8, ED     Status: Abnormal   Collection Time: 12/25/2015  4:32 PM  Result Value Ref Range   Sodium 135 135 - 145 mmol/L   Potassium 5.7 (H) 3.5 - 5.1 mmol/L   Chloride 96 (L) 101 - 111 mmol/L   BUN 57 (H) 6 - 20 mg/dL   Creatinine, Ser 4.90 (H) 0.44 - 1.00 mg/dL   Glucose, Bld 98 65 - 99 mg/dL   Calcium, Ion 0.99 (L) 1.12 - 1.23 mmol/L   TCO2 27 0 - 100 mmol/L   Hemoglobin 11.6 (L) 12.0 - 15.0 g/dL   HCT 34.0 (L) 36.0 - 46.0 %  I-Stat CG4 Lactic Acid, ED     Status: Abnormal   Collection Time: 01/06/2016  6:51 PM  Result Value Ref Range   Lactic Acid, Venous 4.43 (HH) 0.5 - 1.9 mmol/L   Comment NOTIFIED PHYSICIAN   I-Stat venous blood gas, ED     Status: Abnormal   Collection Time: 01/01/2016  6:52 PM  Result Value Ref Range   pH, Ven 7.461 (H) 7.250 - 7.300   pCO2, Ven 40.9 (L) 45.0 - 50.0 mmHg   pO2, Ven 48.0 (H) 31.0 - 45.0 mmHg   Bicarbonate 29.2 (H) 20.0 - 24.0 mEq/L   TCO2 30 0 - 100 mmol/L   O2 Saturation 86.0 %  Acid-Base Excess 5.0 (H) 0.0 - 2.0 mmol/L   Patient temperature HIDE    Collection site BRACHIAL ARTERY    Sample type VENOUS    Ct Abdomen  Pelvis Wo Contrast  12/27/2015  CLINICAL DATA:  Lower abdominal pain and lactic acidosis. End-stage renal disease. History of colon cancer. EXAM: CT ABDOMEN AND PELVIS WITHOUT CONTRAST TECHNIQUE: Multidetector CT imaging of the abdomen and pelvis was performed following the standard protocol without IV contrast. COMPARISON:  None. FINDINGS: There is a small right effusion. Mild bibasilar atelectasis. No other acute abnormalities in the lower chest. There is free air throughout the abdomen. The colon is normal in caliber and much smaller than the small bowel. The stomach is normal in appearance. The small bowel loops are diffusely distended. Two potential transition points are identified. First, there is a hernia in the right anterior lateral abdomen as seen on axial image 56. There appears to be an abrupt transition point between the dilated loops of small bowel entering the hernia and a nondilated loop of bowel leaving the hernia. I suspect this is the cause of the small bowel obstruction. There is also an a tubular masslike process in the right lower quadrant, just inferior to the anastomosis best seen on axial images 50 through 61. It is possible that this is a mass. However, I suspect it is more likely to represent abnormal small bowel loops with wall thickening. There is ascites throughout the abdomen. An oval region of low attenuation projected over the right hepatic lobe on axial image 27 is thought to be an abnormal gallbladder containing debris/ sludge with wall thickening and pericholecystic fluid. The remainder of the liver is normal. The spleen is unremarkable. Probable adrenal hyperplasia. Small kidneys consistent with end-stage renal disease. The pancreas is normal. Atherosclerosis is seen in the abdominal aorta. No obvious adenopathy although evaluation is limited. The pelvis demonstrates no adenopathy or mass. The bladder is decompressed. No masses noted. No evidence of bony metastatic disease. No  acute bony abnormalities. IMPRESSION: 1. There is free air in the abdomen consistent with bowel perforation. 2. There is a small bowel obstruction. Evaluation for a transition point is difficult given the lack of intra-abdominal fat and contrast. Two potential sites of transition are identified. The first is the right anterior lateral hernia. The loop of bowel exiting the hernia appears to be relatively decompressed compared to the more proximal dilated bowel. Additionally, there is a tubular abnormality in the right lower quadrant described above. This is favored to represent abnormal loops of bowel with wall thickening. This is another persist potential site of obstruction. This abnormality is thought to be less likely to represent a mass. 3. The gallbladder is abnormal in appearance with possible sludge, wall thickening, and pericholecystic fluid. An ultrasound could better evaluate. The findings were called to Dr. Rex Kras Electronically Signed   By: Dorise Bullion III M.D   On: 12/26/2015 19:32   Dg Chest Port 1 View  12/23/2015  CLINICAL DATA:  Tachypnea, abdominal pain, abnormal EKG today. History of hypertension, diabetes, chronic kidney disease. EXAM: PORTABLE CHEST 1 VIEW COMPARISON:  None. FINDINGS: Heart size is upper normal. Atherosclerotic changes noted at the aortic arch. Study is hypoinspiratory with crowding of the perihilar bronchovascular markings. Elevated right hemidiaphragm obscures visualization of the right lung base. Visualized portions of the lungs are clear. No evidence of pneumonia. No pleural effusion or pneumothorax seen. Osseous structures about the chest are unremarkable. Dialysis catheter appears adequately positioned with tip  at the level of the mid SVC. IMPRESSION: 1. Low lung volumes. No acute findings. No evidence of pneumonia. No evidence of CHF. 2. Aortic atherosclerosis. Electronically Signed   By: Franki Cabot M.D.   On: 01/07/2016 16:39    Review of Systems   Constitutional: Negative for fever, chills and weight loss.  HENT: Negative for ear pain, hearing loss and tinnitus.   Eyes: Negative for blurred vision, double vision and photophobia.  Respiratory: Negative for cough, hemoptysis and sputum production.   Cardiovascular: Negative for chest pain, palpitations, orthopnea and claudication.  Gastrointestinal: Positive for abdominal pain. Negative for heartburn, nausea, vomiting, diarrhea and constipation.  Genitourinary: Negative for dysuria, urgency and frequency.  Musculoskeletal: Negative for myalgias and neck pain.  Skin: Negative for itching and rash.  Neurological: Negative for dizziness, tingling and headaches.  Psychiatric/Behavioral: Negative for depression and suicidal ideas.    Blood pressure 169/59, pulse 104, temperature 98.8 F (37.1 C), temperature source Rectal, resp. rate 41, weight 65.006 kg (143 lb 5 oz), SpO2 93 %. Physical Exam  Constitutional: She is oriented to person, place, and time. She appears well-developed and well-nourished.  HENT:  Head: Normocephalic and atraumatic.  Right Ear: External ear normal.  Left Ear: External ear normal.  Eyes: Conjunctivae and EOM are normal. Pupils are equal, round, and reactive to light.  Neck: Normal range of motion. Neck supple. No tracheal deviation present. No thyromegaly present.  Cardiovascular: Normal rate, regular rhythm and normal heart sounds.   Respiratory: Effort normal and breath sounds normal. No stridor. No respiratory distress. She has no wheezes. She has no rales. She exhibits no tenderness.  GI: Soft. There is tenderness (generalized). There is guarding. There is no rebound. A hernia is present. Hernia confirmed positive in the ventral area.    Musculoskeletal: Normal range of motion.  Neurological: She is alert and oriented to person, place, and time.  Skin: Skin is warm and dry.  Psychiatric: She has a normal mood and affect. Her behavior is normal.      Assessment/Plan 81 year old female with perforated viscus Diabetes Hypertension Dementia End-stage renal disease on hemodialysis  1. I discussed with the patient and her son that secondary to perforated viscus requiring emergent exploratory laparotomy, with possible ostomy, possible small bowel resection.  2. Discussed with them the risks benefits of the procedure to include but not limited to: Infection, bleeding, damage structures, possible ostomy, possible need for further surgery. The patient and her son voiced understanding and wished to proceed. 3. Dr. Rex Kras, EDP states that she spoke with Dr. Corinna Lines of [facility] to lead in her care postoperatively.

## 2016-01-03 NOTE — ED Notes (Signed)
IV team able to establish #22 access in right arm; unable to obtain enough blood for cultures or venous gas.

## 2016-01-03 NOTE — ED Notes (Signed)
Unable to obtain 2nd access or 2nd set of cultures x 2 attempts.

## 2016-01-03 NOTE — Anesthesia Procedure Notes (Signed)
Procedure Name: Intubation Date/Time: 12/19/2015 10:04 PM Performed by: Jaylen Claude S Pre-anesthesia Checklist: Patient identified, Emergency Drugs available, Suction available, Patient being monitored and Timeout performed Patient Re-evaluated:Patient Re-evaluated prior to inductionOxygen Delivery Method: Circle system utilized Preoxygenation: Pre-oxygenation with 100% oxygen Intubation Type: IV induction Ventilation: Mask ventilation without difficulty Laryngoscope Size: Mac and 3 Grade View: Grade I Tube type: Subglottic suction tube Tube size: 7.5 mm Number of attempts: 1 Airway Equipment and Method: Stylet Placement Confirmation: ETT inserted through vocal cords under direct vision,  positive ETCO2 and breath sounds checked- equal and bilateral Secured at: 22 cm Tube secured with: Tape Dental Injury: Teeth and Oropharynx as per pre-operative assessment

## 2016-01-03 NOTE — ED Notes (Signed)
Critical Lactic Acid values reported to Dr.Little

## 2016-01-03 NOTE — ED Notes (Signed)
IV team at bedside 

## 2016-01-03 NOTE — ED Notes (Signed)
Surgeon at bedside.  

## 2016-01-03 NOTE — ED Notes (Signed)
Abdominal pain starting after dialysis on Thursday; had a large BM that was normal. Since then had loose stools Friday. Went to dialysis today, on machine for about an hour, reported stomach pain to dialysis staff and sent to hospital. Endorses nausea. Denies vomiting. Denies any stool today. Pain 9/10 in RLQ.

## 2016-01-03 NOTE — Anesthesia Preprocedure Evaluation (Signed)
Anesthesia Evaluation  Patient identified by MRN, date of birth, ID band Patient confused    History of Anesthesia Complications Negative for: history of anesthetic complications  Airway Mallampati: II  TM Distance: >3 FB Neck ROM: Full    Dental  (+) Poor Dentition   Pulmonary neg pulmonary ROS,     + decreased breath sounds      Cardiovascular hypertension, +CHF   Rhythm:Regular Rate:Tachycardia     Neuro/Psych negative neurological ROS     GI/Hepatic abd pain, free air    Endo/Other  diabetes  Renal/GU ESRF and DialysisRenal disease     Musculoskeletal   Abdominal   Peds  Hematology   Anesthesia Other Findings   Reproductive/Obstetrics                             Anesthesia Physical Anesthesia Plan  ASA: IV and emergent  Anesthesia Plan: General   Post-op Pain Management:    Induction: Intravenous  Airway Management Planned: Oral ETT  Additional Equipment:   Intra-op Plan:   Post-operative Plan: Extubation in OR  Informed Consent: I have reviewed the patients History and Physical, chart, labs and discussed the procedure including the risks, benefits and alternatives for the proposed anesthesia with the patient or authorized representative who has indicated his/her understanding and acceptance.   Dental advisory given  Plan Discussed with: CRNA and Surgeon  Anesthesia Plan Comments:         Anesthesia Quick Evaluation

## 2016-01-03 NOTE — ED Notes (Signed)
Son advised this RN that he ok's Surgery; Dr.Ramieraz paged to RN for confirmation; Consent formed signed by Pandora Leiter

## 2016-01-03 NOTE — Consult Note (Signed)
PULMONARY / CRITICAL CARE MEDICINE   Name: Shelly Cain MRN: [id number] DOB: [birth year]    ADMISSION DATE:  12/27/2015 CONSULTATION DATE:  01/05/2016  REFERRING MD:  EDP  CHIEF COMPLAINT:  Abdominal pain  HISTORY OF PRESENT ILLNESS:   Shelly Cain is a 43F with PMH significant for ESRD on iHD TThSa, HTN, hx colon cancer, diabetes, and dementia who presented to the ED with complaints of abdominal pain. She has been having pain since her last dialysis session. It was localized to the lower abdomen. Her pain persisted and her bowel movements were looser than normal and associated with some increased pain. She went to dialysis today and had acute worsening of her pain which prompted her to be sent to the ED after about 1 hour of iHD. She endorses associated nausea and vomiting. Her pain is mostly localized to the right lower quadrant and is severe - 9/10.   A CT abdomen was obtained that showed free air in the abdomen with a likely SBO with transition point - no clear site of perforation, however. Her exam was notable for diffuse tenderness with guarding, but no rebound. LA was 8.25 on presentation. Surgery was consulted and, after discussion with the patient and her son, she was taken for exploratory laparotomy. She had bowel necrosis and required bowel resection, extensive washout and end ileostomy.   Post-operatively, she is intubated and receiving intermittent fentanyl. She arouses easily and follows commands. She is somewhat agitated. No family at bedside to provide additional history.   PAST MEDICAL HISTORY :  She  has a past medical history of Hypertension; Diabetes mellitus without complication ([place]); Chronic kidney disease; Cancer ([place]); and Thyroid disease.  PAST SURGICAL HISTORY: She  has no past surgical history on file.  No Known Allergies  No current facility-administered medications on file prior to encounter.   Current Outpatient Prescriptions on File Prior to Encounter   Medication Sig  . acetaminophen (TYLENOL) 325 MG tablet Take 650 mg by mouth every 4 (four) hours as needed.  Marland Kitchen allopurinol (ZYLOPRIM) 100 MG tablet Take 100 mg by mouth daily.  . Amino Acids-Protein Hydrolys (PRO-STAT PO) Take 30 mLs by mouth 3 (three) times daily.  Marland Kitchen amLODipine (NORVASC) 10 MG tablet Take 10 mg by mouth daily.  Marland Kitchen aspirin 81 MG tablet Take 81 mg by mouth daily.  . cloNIDine (CATAPRES) 0.2 MG tablet Take 1 tablet (0.2 mg total) by mouth 3 (three) times daily.  . hydrALAZINE (APRESOLINE) 100 MG tablet Take 100 mg by mouth 2 (two) times daily.  . isosorbide dinitrate (ISORDIL) 20 MG tablet Take 20 mg by mouth 3 (three) times daily.  Marland Kitchen levothyroxine (SYNTHROID, LEVOTHROID) 25 MCG tablet Take 25 mcg by mouth daily before breakfast.  . linagliptin (TRADJENTA) 5 MG TABS tablet Take 5 mg by mouth daily.  Marland Kitchen lisinopril (PRINIVIL,ZESTRIL) 20 MG tablet Take 20 mg by mouth daily.  . sevelamer carbonate (RENVELA) 800 MG tablet Take 800 mg by mouth. Take 5 tablets with each meal and 3 tablets with each snack  . vitamin B-12 (CYANOCOBALAMIN) 1000 MCG tablet Take 1 tablet (1,000 mcg total) by mouth daily.    FAMILY HISTORY:  Her has no family status information on file.   SOCIAL HISTORY: She  reports that she has never smoked. She has never used smokeless tobacco. She reports that she does not drink alcohol or use illicit drugs.  REVIEW OF SYSTEMS:   Unable to obtain 2/2 intubated state.  SUBJECTIVE:    VITAL  SIGNS: BP (!) 152/53   Pulse 83   Temp 100.3 F (37.9 C) (Axillary)   Resp 15   Wt 65 kg (143 lb 5 oz)   SpO2 100%   BMI 23.13 kg/m    HEMODYNAMICS:    VENTILATOR SETTINGS:    INTAKE / OUTPUT: I/O last 3 completed shifts: In: 2700 [I.V.:2700] Out: -   PHYSICAL EXAMINATION:  General Well nourished, well developed, intubated  HEENT No gross abnormalities. ETT/OGT in place  Pulmonary Clear to auscultation bilaterally with no wheezes, rales or ronchi. Good  effort, symmetrical expansion.   Cardiovascular Normal rate, regular rhythm. S1, s2. No m/r/g. Distal pulses palpable.  Abdomen Soft. Bulky dressing in place c/d/i. Stoma pink with scant serous drainage. Mild tenderness to palpation. +BS.   Musculoskeletal Grossly normal.   Lymphatics No cervical, supraclavicular or axillary adenopathy.   Neurologic Grossly intact. No focal deficits.   Skin/Integuement No rash, no cyanosis, no clubbing. No edema. Right upper chest PermCath.      LABS:  BMET  Recent Labs Lab 01/12/2016 1544 12/16/2015 1632  NA 139 135  K 5.3* 5.7*  CL 96* 96*  CO2 24  --   BUN 44* 57*  CREATININE 4.60* 4.90*  GLUCOSE 110* 98    Electrolytes  Recent Labs Lab 12/27/2015 1544  CALCIUM 9.5    CBC  Recent Labs Lab 01/02/2016 1544 12/19/2015 1632  WBC 7.3  --   HGB 9.7* 11.6*  HCT 30.5* 34.0*  PLT 147*  --     Coag's No results for input(s): APTT, INR in the last 168 hours.  Sepsis Markers  Recent Labs Lab 01/11/2016 1552 01/02/2016 1851  LATICACIDVEN 8.25* 4.43*    ABG No results for input(s): PHART, PCO2ART, PO2ART in the last 168 hours.  Liver Enzymes  Recent Labs Lab 12/31/2015 1544  AST 50*  ALT 39  ALKPHOS 39  BILITOT 0.6  ALBUMIN 3.8    Cardiac Enzymes No results for input(s): TROPONINI, PROBNP in the last 168 hours.  Glucose No results for input(s): GLUCAP in the last 168 hours.  Imaging Ct Abdomen Pelvis Wo Contrast  12/20/2015  CLINICAL DATA:  Lower abdominal pain and lactic acidosis. End-stage renal disease. History of colon cancer. EXAM: CT ABDOMEN AND PELVIS WITHOUT CONTRAST TECHNIQUE: Multidetector CT imaging of the abdomen and pelvis was performed following the standard protocol without IV contrast. COMPARISON:  None. FINDINGS: There is a small right effusion. Mild bibasilar atelectasis. No other acute abnormalities in the lower chest. There is free air throughout the abdomen. The colon is normal in caliber and much smaller  than the small bowel. The stomach is normal in appearance. The small bowel loops are diffusely distended. Two potential transition points are identified. First, there is a hernia in the right anterior lateral abdomen as seen on axial image 56. There appears to be an abrupt transition point between the dilated loops of small bowel entering the hernia and a nondilated loop of bowel leaving the hernia. I suspect this is the cause of the small bowel obstruction. There is also an a tubular masslike process in the right lower quadrant, just inferior to the anastomosis best seen on axial images 50 through 61. It is possible that this is a mass. However, I suspect it is more likely to represent abnormal small bowel loops with wall thickening. There is ascites throughout the abdomen. An oval region of low attenuation projected over the right hepatic lobe on axial image 27 is thought to  be an abnormal gallbladder containing debris/ sludge with wall thickening and pericholecystic fluid. The remainder of the liver is normal. The spleen is unremarkable. Probable adrenal hyperplasia. Small kidneys consistent with end-stage renal disease. The pancreas is normal. Atherosclerosis is seen in the abdominal aorta. No obvious adenopathy although evaluation is limited. The pelvis demonstrates no adenopathy or mass. The bladder is decompressed. No masses noted. No evidence of bony metastatic disease. No acute bony abnormalities. IMPRESSION: 1. There is free air in the abdomen consistent with bowel perforation. 2. There is a small bowel obstruction. Evaluation for a transition point is difficult given the lack of intra-abdominal fat and contrast. Two potential sites of transition are identified. The first is the right anterior lateral hernia. The loop of bowel exiting the hernia appears to be relatively decompressed compared to the more proximal dilated bowel. Additionally, there is a tubular abnormality in the right lower quadrant  described above. This is favored to represent abnormal loops of bowel with wall thickening. This is another persist potential site of obstruction. This abnormality is thought to be less likely to represent a mass. 3. The gallbladder is abnormal in appearance with possible sludge, wall thickening, and pericholecystic fluid. An ultrasound could better evaluate. The findings were called to Dr. Rex Kras Electronically Signed   By: Dorise Bullion III M.D   On: 01/10/2016 19:32   Dg Chest Port 1 View  12/22/2015  CLINICAL DATA:  Tachypnea, abdominal pain, abnormal EKG today. History of hypertension, diabetes, chronic kidney disease. EXAM: PORTABLE CHEST 1 VIEW COMPARISON:  None. FINDINGS: Heart size is upper normal. Atherosclerotic changes noted at the aortic arch. Study is hypoinspiratory with crowding of the perihilar bronchovascular markings. Elevated right hemidiaphragm obscures visualization of the right lung base. Visualized portions of the lungs are clear. No evidence of pneumonia. No pleural effusion or pneumothorax seen. Osseous structures about the chest are unremarkable. Dialysis catheter appears adequately positioned with tip at the level of the mid SVC. IMPRESSION: 1. Low lung volumes. No acute findings. No evidence of pneumonia. No evidence of CHF. 2. Aortic atherosclerosis. Electronically Signed   By: Franki Cabot M.D.   On: 01/01/2016 16:39     STUDIES:  Imaging as above  CULTURES:   ANTIBIOTICS: Zosyn 7/22 >> Vancomycin 7/22 >>  SIGNIFICANT EVENTS: Distal ileum and colon resection; end ileostomy w/ [place] pouch  LINES/TUBES: PIV  DISCUSSION: Ms. Markie is a 62F w/ PMH significant for ESRD on iHD TTSa, DMII, dementia, HTN, hx colon ca, who presented with acute small bowel obstruction and perforation and was taken urgently to the OR for exploratory laparotomy with bowel resection and end ileostomy. She has received vanc and Zosyn for antibiotic coverage.   ASSESSMENT /  PLAN:  GASTROINTESTINAL A:   Acute SBO w/ perforation s/p terminal ileum and colon resection with end ileostomy P:   Vanc/Zosyn NPO Monitor bowel function Pain control  PULMONARY A: Intubated for procedure P:   Continue mechanical ventilation for now Likely quick wean and SBT to extubation in AM  CARDIOVASCULAR A:  Post-procedural hypotension requiring neosynephrine Hx hypertension P:  Wean phenylephrine Maintain MAP > 65  RENAL A:   ESRD on iHD TTSa P:   Nephrology consult in AM as she may require HD prior to Tuesday Monitor lytes   HEMATOLOGIC A:   No acute issues P:  Monitor  INFECTIOUS A:   Concern for evolving intraabdominal sepsis s/p bowel perforation P:   Zosyn  ENDOCRINE A:   Hypoglycemia post-operatively (iatrogenic)  Hx DMII  Hypothyroidism  P:   Follow CBG Maintain euglycemia Continue synthroid  NEUROLOGIC A:   Cognitive impairment P:   RASS goal: 0 Monitor; high risk for delirium   FAMILY  - Updates: No family at bedside.   - Inter-disciplinary family meet or Palliative Care meeting due by:  day 7  The patient is critically ill with multiple organ system failure and requires high complexity decision making for assessment and support, frequent evaluation and titration of therapies, advanced monitoring, review of radiographic studies and interpretation of complex data.   Critical Care Time devoted to patient care services, exclusive of separately billable procedures, described in this note is 42 minutes.    Yisroel Ramming, MD Pulmonary and [place] Pager: [phone]  01/06/2016, 9:02 PM

## 2016-01-03 NOTE — Progress Notes (Signed)
Pharmacy Antibiotic Note  Shelly Cain is a 81 y.o. female admitted on 01/12/2016 with sepsis.  36 yof c/o abdominal pain s/p dialysis 2 days ago. She had loose stools significant for abdominal pain the following day. Last BM was this morning and she reported rectal pain during BM. She reports worsened abdominal pain and was sent here after only 1 hour of dialysis. She reports some vomiting two days ago and ongoing nausea.  Pharmacy has been consulted for Vancomycin and Zosyn dosing.  EG is currently afebrile with WBC WNL. Lactate is elevated at 8.25.  Vd 58.5L * target peak of 25 mg/L=1500 mg Vanc LD   Plan: - Vancomycin 1500 mg total (1000mg  + 500mg ) x1 for goal trough of 15-20 - Zosyn 3.375 g IV over 30 minutes x1 + Zosyn 2.25 IV over 30 minutes q8h  - F/U on Cx, HD schedule - Monitor Tmax, WBC, Lactate  Weight: 143 lb 5 oz (65.006 kg)  Temp (24hrs), Avg:98.8 F (37.1 C), Min:98.8 F (37.1 C), Max:98.8 F (37.1 C)   Recent Labs Lab 01/10/2016 1544 01/05/2016 1552 12/30/2015 1632  WBC 7.3  --   --   CREATININE  --   --  4.90*  LATICACIDVEN  --  8.25*  --     Estimated Creatinine Clearance: 8.7 mL/min (by C-G formula based on Cr of 4.9).    No Known Allergies  Antimicrobials this admission: Vancomycin 7/22 >>  Zosyn 7/22 >>   Microbiology results: 7/22 BCx: Pending 7/22 UCx: Pending   Thank you for allowing pharmacy to be a part of this patient's care.  Dierdre Harness, [place], PharmD Clinical Pharmacy Resident [phone] (Pager) 01/10/2016 4:59 PM

## 2016-01-04 ENCOUNTER — Encounter (HOSPITAL_COMMUNITY): Payer: Self-pay | Admitting: *Deleted

## 2016-01-04 ENCOUNTER — Inpatient Hospital Stay (HOSPITAL_COMMUNITY): Payer: Medicare (Managed Care)

## 2016-01-04 DIAGNOSIS — N186 End stage renal disease: Secondary | ICD-10-CM

## 2016-01-04 DIAGNOSIS — K631 Perforation of intestine (nontraumatic): Secondary | ICD-10-CM

## 2016-01-04 DIAGNOSIS — Z992 Dependence on renal dialysis: Secondary | ICD-10-CM

## 2016-01-04 LAB — GLUCOSE, CAPILLARY
GLUCOSE-CAPILLARY: 16 mg/dL — AB (ref 65–99)
GLUCOSE-CAPILLARY: 59 mg/dL — AB (ref 65–99)
GLUCOSE-CAPILLARY: 69 mg/dL (ref 65–99)
GLUCOSE-CAPILLARY: 71 mg/dL (ref 65–99)
GLUCOSE-CAPILLARY: 76 mg/dL (ref 65–99)
Glucose-Capillary: 24 mg/dL — CL (ref 65–99)
Glucose-Capillary: 72 mg/dL (ref 65–99)
Glucose-Capillary: 100 mg/dL — ABNORMAL HIGH (ref 65–99)
Glucose-Capillary: 162 mg/dL — ABNORMAL HIGH (ref 65–99)
Glucose-Capillary: 65 mg/dL (ref 65–99)
Glucose-Capillary: 83 mg/dL (ref 65–99)
Glucose-Capillary: 146 mg/dL — ABNORMAL HIGH (ref 65–99)
Glucose-Capillary: 75 mg/dL (ref 65–99)
Glucose-Capillary: 132 mg/dL — ABNORMAL HIGH (ref 65–99)

## 2016-01-04 LAB — LIPID PANEL
Cholesterol: 97 mg/dL (ref 0–200)
HDL: 58 mg/dL (ref >40)
LDL CALC: 25 mg/dL (ref 0–99)
TRIGLYCERIDES: 69 mg/dL (ref <150)
Total CHOL/HDL Ratio: 1.7 RATIO
VLDL: 14 mg/dL (ref 0–40)

## 2016-01-04 LAB — BASIC METABOLIC PANEL
ANION GAP: 16 — AB (ref 5–15)
Anion gap: 13 (ref 5–15)
BUN: 54 mg/dL — AB (ref 6–20)
BUN: 65 mg/dL — ABNORMAL HIGH (ref 6–20)
CALCIUM: 8.1 mg/dL — AB (ref 8.9–10.3)
CO2: 24 mmol/L (ref 22–32)
CO2: 21 mmol/L — ABNORMAL LOW (ref 22–32)
Calcium: 8.3 mg/dL — ABNORMAL LOW (ref 8.9–10.3)
Chloride: 101 mmol/L (ref 101–111)
Chloride: 99 mmol/L — ABNORMAL LOW (ref 101–111)
Creatinine, Ser: 4.62 mg/dL — ABNORMAL HIGH (ref 0.44–1.00)
Creatinine, Ser: 5.36 mg/dL — ABNORMAL HIGH (ref 0.44–1.00)
GFR calc Af Amer: 10 mL/min — ABNORMAL LOW (ref >60)
GFR, EST AFRICAN AMERICAN: 8 mL/min — AB (ref >60)
GFR, EST NON AFRICAN AMERICAN: 8 mL/min — AB (ref >60)
GFR, EST NON AFRICAN AMERICAN: 7 mL/min — AB (ref >60)
GLUCOSE: 88 mg/dL (ref 65–99)
Glucose, Bld: 78 mg/dL (ref 65–99)
POTASSIUM: 5 mmol/L (ref 3.5–5.1)
POTASSIUM: 5.9 mmol/L — AB (ref 3.5–5.1)
SODIUM: 136 mmol/L (ref 135–145)
Sodium: 138 mmol/L (ref 135–145)

## 2016-01-04 LAB — POCT I-STAT 3, ART BLOOD GAS (G3+)
Acid-Base Excess: 1 mmol/L (ref 0.0–2.0)
Bicarbonate: 25.7 mEq/L — ABNORMAL HIGH (ref 20.0–24.0)
O2 Saturation: 100 %
PH ART: 7.397 (ref 7.350–7.450)
TCO2: 27 mmol/L (ref 0–100)
pCO2 arterial: 41.8 mmHg (ref 35.0–45.0)
pO2, Arterial: 263 mmHg — ABNORMAL HIGH (ref 80.0–100.0)

## 2016-01-04 LAB — CBC
HEMATOCRIT: 25.7 % — AB (ref 36.0–46.0)
Hemoglobin: 8.1 g/dL — ABNORMAL LOW (ref 12.0–15.0)
MCH: 27.3 pg (ref 26.0–34.0)
MCHC: 31.5 g/dL (ref 30.0–36.0)
MCV: 86.5 fL (ref 78.0–100.0)
Platelets: 112 10*3/uL — ABNORMAL LOW (ref 150–400)
RBC: 2.97 MIL/uL — ABNORMAL LOW (ref 3.87–5.11)
RDW: 17.2 % — AB (ref 11.5–15.5)
WBC: 3 10*3/uL — ABNORMAL LOW (ref 4.0–10.5)

## 2016-01-04 LAB — URINE CULTURE
CULTURE: NO GROWTH
Special Requests: NORMAL

## 2016-01-04 LAB — MRSA PCR SCREENING: MRSA by PCR: NEGATIVE

## 2016-01-04 MED ORDER — FAMOTIDINE IN NACL 20-0.9 MG/50ML-% IV SOLN: 20.0000 mg | INTRAVENOUS | Status: DC

## 2016-01-04 MED ORDER — INSULIN ASPART 100 UNIT/ML ~~LOC~~ SOLN
0.0000 [IU] | SUBCUTANEOUS | Status: DC
  Administered 2016-01-05: 1 [IU] via SUBCUTANEOUS
  Administered 2016-01-06 (×3): 2 [IU] via SUBCUTANEOUS
  Administered 2016-01-06: 1 [IU] via SUBCUTANEOUS
  Administered 2016-01-06 (×2): 2 [IU] via SUBCUTANEOUS
  Administered 2016-01-07 (×3): 1 [IU] via SUBCUTANEOUS
  Administered 2016-01-07: 2 [IU] via SUBCUTANEOUS
  Administered 2016-01-08: 1 [IU] via SUBCUTANEOUS
  Administered 2016-01-08: 3 [IU] via SUBCUTANEOUS
  Administered 2016-01-08: 2 [IU] via SUBCUTANEOUS
  Administered 2016-01-08: 3 [IU] via SUBCUTANEOUS
  Administered 2016-01-08: 1 [IU] via SUBCUTANEOUS
  Administered 2016-01-09: 2 [IU] via SUBCUTANEOUS

## 2016-01-04 MED ORDER — DEXTROSE-NACL 5-0.9 % IV SOLN
INTRAVENOUS | Status: DC
  Administered 2016-01-04: 05:00:00 via INTRAVENOUS

## 2016-01-04 MED ORDER — MIDAZOLAM HCL 2 MG/2ML IJ SOLN
INTRAMUSCULAR | Status: DC | PRN
  Administered 2016-01-04: 2 mg via INTRAVENOUS

## 2016-01-04 MED ORDER — FENTANYL CITRATE (PF) 100 MCG/2ML IJ SOLN
INTRAMUSCULAR | Status: AC
  Filled 2016-01-04: qty 2

## 2016-01-04 MED ORDER — FENTANYL CITRATE (PF) 100 MCG/2ML IJ SOLN
INTRAMUSCULAR | Status: AC
  Administered 2016-01-04: 100 ug via INTRAVENOUS
  Filled 2016-01-04: qty 2

## 2016-01-04 MED ORDER — DEXTROSE 50 % IV SOLN
INTRAVENOUS | Status: AC
  Administered 2016-01-04: 50 mL
  Filled 2016-01-04: qty 50

## 2016-01-04 MED ORDER — MORPHINE SULFATE (PF) 2 MG/ML IV SOLN
2.0000 mg | INTRAVENOUS | Status: DC | PRN
  Administered 2016-01-04 – 2016-01-07 (×7): 2 mg via INTRAVENOUS
  Administered 2016-01-08: 1 mg via INTRAVENOUS
  Administered 2016-01-08: 2 mg via INTRAVENOUS
  Administered 2016-01-08: 1 mg via INTRAVENOUS
  Administered 2016-01-09: 2 mg via INTRAVENOUS
  Filled 2016-01-04 (×10): qty 1

## 2016-01-04 MED ORDER — DEXTROSE 50 % IV SOLN
1.0000 | Freq: Once | INTRAVENOUS | Status: AC
  Administered 2016-01-04: 50 mL via INTRAVENOUS

## 2016-01-04 MED ORDER — HYDRALAZINE HCL 20 MG/ML IJ SOLN
10.0000 mg | INTRAMUSCULAR | Status: DC | PRN
  Administered 2016-01-05 (×2): 10 mg via INTRAVENOUS
  Filled 2016-01-04 (×2): qty 1

## 2016-01-04 MED ORDER — DEXTROSE 10 % IV SOLN
INTRAVENOUS | Status: DC
Start: 2016-01-04 — End: 2016-01-09
  Administered 2016-01-04 – 2016-01-08 (×3): via INTRAVENOUS

## 2016-01-04 MED ORDER — ISOSORBIDE DINITRATE 20 MG PO TABS
20.0000 mg | ORAL_TABLET | Freq: Three times a day (TID) | ORAL | Status: DC
  Filled 2016-01-04 (×4): qty 1

## 2016-01-04 MED ORDER — DEXTROSE 50 % IV SOLN
25.0000 mL | Freq: Once | INTRAVENOUS | Status: AC
  Administered 2016-01-04: 25 mL via INTRAVENOUS
  Filled 2016-01-04: qty 50

## 2016-01-04 MED ORDER — FENTANYL CITRATE (PF) 100 MCG/2ML IJ SOLN
25.0000 ug | INTRAMUSCULAR | Status: DC | PRN
  Administered 2016-01-04 – 2016-01-05 (×10): 100 ug via INTRAVENOUS
  Filled 2016-01-04 (×11): qty 2

## 2016-01-04 MED ORDER — ENOXAPARIN SODIUM 30 MG/0.3ML ~~LOC~~ SOLN
30.0000 mg | Freq: Every day | SUBCUTANEOUS | Status: DC
  Administered 2016-01-04: 30 mg via SUBCUTANEOUS
  Filled 2016-01-04 (×2): qty 0.3

## 2016-01-04 MED ORDER — FENTANYL CITRATE (PF) 100 MCG/2ML IJ SOLN
25.0000 ug | INTRAMUSCULAR | Status: DC | PRN
  Administered 2016-01-04: 100 ug via INTRAVENOUS

## 2016-01-04 MED ORDER — ANTISEPTIC ORAL RINSE SOLUTION (CORINZ)
7.0000 mL | Freq: Four times a day (QID) | OROMUCOSAL | Status: DC
  Administered 2016-01-04 – 2016-01-09 (×19): 7 mL via OROMUCOSAL

## 2016-01-04 MED ORDER — DEXTROSE 50 % IV SOLN
INTRAVENOUS | Status: AC
  Filled 2016-01-04: qty 50

## 2016-01-04 MED ORDER — FAMOTIDINE IN NACL 20-0.9 MG/50ML-% IV SOLN
20.0000 mg | INTRAVENOUS | Status: DC
  Administered 2016-01-04 – 2016-01-08 (×5): 20 mg via INTRAVENOUS
  Filled 2016-01-04 (×5): qty 50

## 2016-01-04 MED ORDER — CHLORHEXIDINE GLUCONATE 0.12% ORAL RINSE (MEDLINE KIT)
15.0000 mL | Freq: Two times a day (BID) | OROMUCOSAL | Status: DC
  Administered 2016-01-04 – 2016-01-09 (×11): 15 mL via OROMUCOSAL

## 2016-01-04 NOTE — Consult Note (Signed)
Reason for Consult: To manage dialysis and dialysis related needs Referring Physician: Aimar Shrewsbury is an 81 y.o. female with PMhx significant for diabetes mellitus, hypertension, previous colon cancer and also fairly new ESRD initiated 6 months ago currently dialyzing at the [facility] on a TTS schedule. She presented to dialysis yesterday she complained of abdominal pain. She received only about an hour of her 4 hour treatment and was sent to the hospital. CT scan revealed free air. She is status post exploratory laparoscopy where ischemic bowel was found. She underwent resection of her distal ileum and right side of colon with terminal ileostomy and is currently in the ICU ventilated. Her potassium yesterday was 5.7, is 5.0 today. She is hemodynamically stable- in fact hypertensive.   Dialyzes at [facility]  EDW 62 kg although it was looking like it needed to be lowered. HD Bath 2 potassium/2 calcium, Dialyzer 180, Heparin yes. Access using PermCath but has maturing AV fistula. At kidney Center was currently receiving Venofer, Mircera 50 every 2 weeks and calcitriol 1.75 every HD. Last hemoglobin 11.8, calcium 9.4, phosphorus 8.1 on Renvela, PTH 381  Past Medical History:  Diagnosis Date  . Cancer [facility])    Colon  . Chronic kidney disease   . Diabetes mellitus without complication ([place])   . Hypertension   . Thyroid disease     History reviewed. No pertinent surgical history.  History reviewed. No pertinent family history.  Social History:  reports that she has never smoked. She has never used smokeless tobacco. She reports that she does not drink alcohol or use drugs.  Allergies: No Known Allergies  Medications: I have reviewed the patient's current medications.   Results for orders placed or performed during the hospital encounter of 01/07/2016 (from the past 48 hour(s))  Comprehensive metabolic panel     Status: Abnormal   Collection  Time: 01/12/2016  3:44 PM  Result Value Ref Range   Sodium 139 135 - 145 mmol/L   Potassium 5.3 (H) 3.5 - 5.1 mmol/L   Chloride 96 (L) 101 - 111 mmol/L   CO2 24 22 - 32 mmol/L   Glucose, Bld 110 (H) 65 - 99 mg/dL   BUN 44 (H) 6 - 20 mg/dL   Creatinine, Ser 4.60 (H) 0.44 - 1.00 mg/dL   Calcium 9.5 8.9 - 10.3 mg/dL   Total Protein 6.7 6.5 - 8.1 g/dL   Albumin 3.8 3.5 - 5.0 g/dL   AST 50 (H) 15 - 41 U/L   ALT 39 14 - 54 U/L   Alkaline Phosphatase 39 38 - 126 U/L   Total Bilirubin 0.6 0.3 - 1.2 mg/dL   GFR calc non Af Amer 8 (L) >60 mL/min   GFR calc Af Amer 10 (L) >60 mL/min    Comment: (NOTE) The eGFR has been calculated using the CKD EPI equation. This calculation has not been validated in all clinical situations. eGFR's persistently <60 mL/min signify possible Chronic Kidney Disease.    Anion gap 19 (H) 5 - 15  Lipase, blood     Status: None   Collection Time: 01/08/2016  3:44 PM  Result Value Ref Range   Lipase 23 11 - 51 U/L  CBC with Differential     Status: Abnormal   Collection Time: 12/29/2015  3:44 PM  Result Value Ref Range   WBC 7.3 4.0 - 10.5 K/uL   RBC 3.45 (L) 3.87 - 5.11 MIL/uL   Hemoglobin 9.7 (L) 12.0 -  15.0 g/dL   HCT 30.5 (L) 36.0 - 46.0 %   MCV 88.4 78.0 - 100.0 fL   MCH 28.1 26.0 - 34.0 pg   MCHC 31.8 30.0 - 36.0 g/dL   RDW 17.4 (H) 11.5 - 15.5 %   Platelets 147 (L) 150 - 400 K/uL   Neutrophils Relative % 87 %   Lymphocytes Relative 7 %   Monocytes Relative 3 %   Eosinophils Relative 0 %   Basophils Relative 0 %   Band Neutrophils 0 %   Metamyelocytes Relative 2 %   Myelocytes 1 %   Promyelocytes Absolute 0 %   Blasts 0 %   nRBC 0 0 /100 WBC   Other 0 %   Neutro Abs 6.6 1.7 - 7.7 K/uL   Lymphs Abs 0.5 (L) 0.7 - 4.0 K/uL   Monocytes Absolute 0.2 0.1 - 1.0 K/uL   Eosinophils Absolute 0.0 0.0 - 0.7 K/uL   Basophils Absolute 0.0 0.0 - 0.1 K/uL  POC occult blood, ED Provider will collect     Status: None   Collection Time: 12/27/2015  3:48 PM  Result  Value Ref Range   Fecal Occult Bld NEGATIVE NEGATIVE  I-Stat CG4 Lactic Acid, ED     Status: Abnormal   Collection Time: 12/23/2015  3:52 PM  Result Value Ref Range   Lactic Acid, Venous 8.25 (HH) 0.5 - 1.9 mmol/L   Comment NOTIFIED PHYSICIAN   Urinalysis, Routine w reflex microscopic     Status: Abnormal   Collection Time: 12/24/2015  4:23 PM  Result Value Ref Range   Color, Urine YELLOW YELLOW   APPearance CLEAR CLEAR   Specific Gravity, Urine 1.025 1.005 - 1.030   pH 6.0 5.0 - 8.0   Glucose, UA NEGATIVE NEGATIVE mg/dL   Hgb urine dipstick LARGE (A) NEGATIVE   Bilirubin Urine SMALL (A) NEGATIVE   Ketones, ur NEGATIVE NEGATIVE mg/dL   Protein, ur >300 (A) NEGATIVE mg/dL   Nitrite NEGATIVE NEGATIVE   Leukocytes, UA TRACE (A) NEGATIVE  Urine microscopic-add on     Status: Abnormal   Collection Time: 01/02/2016  4:23 PM  Result Value Ref Range   Squamous Epithelial / LPF 0-5 (A) NONE SEEN   WBC, UA 0-5 0 - 5 WBC/hpf   RBC / HPF 0-5 0 - 5 RBC/hpf   Bacteria, UA NONE SEEN NONE SEEN   Urine-Other MICROSCOPIC EXAM PERFORMED ON UNCONCENTRATED URINE   I-stat Chem 8, ED     Status: Abnormal   Collection Time: 01/02/2016  4:32 PM  Result Value Ref Range   Sodium 135 135 - 145 mmol/L   Potassium 5.7 (H) 3.5 - 5.1 mmol/L   Chloride 96 (L) 101 - 111 mmol/L   BUN 57 (H) 6 - 20 mg/dL   Creatinine, Ser 4.90 (H) 0.44 - 1.00 mg/dL   Glucose, Bld 98 65 - 99 mg/dL   Calcium, Ion 0.99 (L) 1.12 - 1.23 mmol/L   TCO2 27 0 - 100 mmol/L   Hemoglobin 11.6 (L) 12.0 - 15.0 g/dL   HCT 34.0 (L) 36.0 - 46.0 %  I-Stat CG4 Lactic Acid, ED     Status: Abnormal   Collection Time: 12/24/2015  6:51 PM  Result Value Ref Range   Lactic Acid, Venous 4.43 (HH) 0.5 - 1.9 mmol/L   Comment NOTIFIED PHYSICIAN   I-Stat venous blood gas, ED     Status: Abnormal   Collection Time: 01/11/2016  6:52 PM  Result Value Ref Range  pH, Ven 7.461 (H) 7.250 - 7.300   pCO2, Ven 40.9 (L) 45.0 - 50.0 mmHg   pO2, Ven 48.0 (H) 31.0 -  45.0 mmHg   Bicarbonate 29.2 (H) 20.0 - 24.0 mEq/L   TCO2 30 0 - 100 mmol/L   O2 Saturation 86.0 %   Acid-Base Excess 5.0 (H) 0.0 - 2.0 mmol/L   Patient temperature HIDE    Collection site BRACHIAL ARTERY    Sample type VENOUS   Glucose, capillary     Status: Abnormal   Collection Time: 01/04/16 12:41 AM  Result Value Ref Range   Glucose-Capillary 19 (LL) 65 - 99 mg/dL   Comment 1 Capillary Specimen    Comment 2 Notify RN   Glucose, capillary     Status: Abnormal   Collection Time: 01/04/16 12:44 AM  Result Value Ref Range   Glucose-Capillary 16 (LL) 65 - 99 mg/dL   Comment 1 Capillary Specimen    Comment 2 Notify RN   Glucose, capillary     Status: Abnormal   Collection Time: 01/04/16  1:04 AM  Result Value Ref Range   Glucose-Capillary 24 (LL) 65 - 99 mg/dL   Comment 1 Capillary Specimen    Comment 2 Notify RN   Glucose, capillary     Status: None   Collection Time: 01/04/16  1:21 AM  Result Value Ref Range   Glucose-Capillary 76 65 - 99 mg/dL  I-STAT 3, arterial blood gas (G3+)     Status: Abnormal   Collection Time: 01/04/16  2:11 AM  Result Value Ref Range   pH, Arterial 7.397 7.350 - 7.450   pCO2 arterial 41.8 35.0 - 45.0 mmHg   pO2, Arterial 263.0 (H) 80.0 - 100.0 mmHg   Bicarbonate 25.7 (H) 20.0 - 24.0 mEq/L   TCO2 27 0 - 100 mmol/L   O2 Saturation 100.0 %   Acid-Base Excess 1.0 0.0 - 2.0 mmol/L   Patient temperature 98.7 F    Collection site RADIAL, ALLEN'S TEST ACCEPTABLE    Drawn by RT    Sample type ARTERIAL   Glucose, capillary     Status: None   Collection Time: 01/04/16  2:35 AM  Result Value Ref Range   Glucose-Capillary 71 65 - 99 mg/dL   Comment 1 Capillary Specimen    Comment 2 Notify RN   MRSA PCR Screening     Status: None   Collection Time: 01/04/16  3:00 AM  Result Value Ref Range   MRSA by PCR NEGATIVE NEGATIVE    Comment:        The GeneXpert MRSA Assay (FDA approved for NASAL specimens only), is one component of a comprehensive MRSA  colonization surveillance program. It is not intended to diagnose MRSA infection nor to guide or monitor treatment for MRSA infections.   Glucose, capillary     Status: None   Collection Time: 01/04/16  3:03 AM  Result Value Ref Range   Glucose-Capillary 72 65 - 99 mg/dL   Comment 1 Capillary Specimen    Comment 2 Notify RN   Glucose, capillary     Status: Abnormal   Collection Time: 01/04/16  4:08 AM  Result Value Ref Range   Glucose-Capillary 100 (H) 65 - 99 mg/dL   Comment 1 Venous Specimen    Comment 2 Notify RN   Basic metabolic panel     Status: Abnormal   Collection Time: 01/04/16  4:15 AM  Result Value Ref Range   Sodium 138 135 - 145 mmol/L  Potassium 5.0 3.5 - 5.1 mmol/L   Chloride 101 101 - 111 mmol/L   CO2 24 22 - 32 mmol/L   Glucose, Bld 88 65 - 99 mg/dL   BUN 54 (H) 6 - 20 mg/dL   Creatinine, Ser 4.62 (H) 0.44 - 1.00 mg/dL   Calcium 8.1 (L) 8.9 - 10.3 mg/dL   GFR calc non Af Amer 8 (L) >60 mL/min   GFR calc Af Amer 10 (L) >60 mL/min    Comment: (NOTE) The eGFR has been calculated using the CKD EPI equation. This calculation has not been validated in all clinical situations. eGFR's persistently <60 mL/min signify possible Chronic Kidney Disease.    Anion gap 13 5 - 15  CBC     Status: Abnormal   Collection Time: 01/04/16  4:15 AM  Result Value Ref Range   WBC 3.0 (L) 4.0 - 10.5 K/uL   RBC 2.97 (L) 3.87 - 5.11 MIL/uL   Hemoglobin 8.1 (L) 12.0 - 15.0 g/dL    Comment: DELTA CHECK NOTED REPEATED TO VERIFY    HCT 25.7 (L) 36.0 - 46.0 %   MCV 86.5 78.0 - 100.0 fL   MCH 27.3 26.0 - 34.0 pg   MCHC 31.5 30.0 - 36.0 g/dL   RDW 17.2 (H) 11.5 - 15.5 %   Platelets 112 (L) 150 - 400 K/uL    Comment: PLATELET COUNT CONFIRMED BY SMEAR  Lipid panel     Status: None   Collection Time: 01/04/16  4:15 AM  Result Value Ref Range   Cholesterol 97 0 - 200 mg/dL   Triglycerides 69 <150 mg/dL   HDL 58 >40 mg/dL   Total CHOL/HDL Ratio 1.7 RATIO   VLDL 14 0 - 40 mg/dL    LDL Cholesterol 25 0 - 99 mg/dL    Comment:        Total Cholesterol/HDL:CHD Risk Coronary Heart Disease Risk Table                     Men   Women  1/2 Average Risk   3.4   3.3  Average Risk       5.0   4.4  2 X Average Risk   9.6   7.1  3 X Average Risk  23.4   11.0        Use the calculated Patient Ratio above and the CHD Risk Table to determine the patient's CHD Risk.        ATP III CLASSIFICATION (LDL):  <100     mg/dL   Optimal  100-129  mg/dL   Near or Above                    Optimal  130-159  mg/dL   Borderline  160-189  mg/dL   High  >190     mg/dL   Very High   Glucose, capillary     Status: None   Collection Time: 01/04/16  6:43 AM  Result Value Ref Range   Glucose-Capillary 65 65 - 99 mg/dL  Glucose, capillary     Status: Abnormal   Collection Time: 01/04/16  6:57 AM  Result Value Ref Range   Glucose-Capillary 162 (H) 65 - 99 mg/dL   Comment 1 Capillary Specimen     Ct Abdomen Pelvis Wo Contrast  Result Date: 01/12/2016 CLINICAL DATA:  Lower abdominal pain and lactic acidosis. End-stage renal disease. History of colon cancer. EXAM: CT ABDOMEN AND PELVIS WITHOUT CONTRAST TECHNIQUE:  Multidetector CT imaging of the abdomen and pelvis was performed following the standard protocol without IV contrast. COMPARISON:  None. FINDINGS: There is a small right effusion. Mild bibasilar atelectasis. No other acute abnormalities in the lower chest. There is free air throughout the abdomen. The colon is normal in caliber and much smaller than the small bowel. The stomach is normal in appearance. The small bowel loops are diffusely distended. Two potential transition points are identified. First, there is a hernia in the right anterior lateral abdomen as seen on axial image 56. There appears to be an abrupt transition point between the dilated loops of small bowel entering the hernia and a nondilated loop of bowel leaving the hernia. I suspect this is the cause of the small bowel  obstruction. There is also an a tubular masslike process in the right lower quadrant, just inferior to the anastomosis best seen on axial images 50 through 61. It is possible that this is a mass. However, I suspect it is more likely to represent abnormal small bowel loops with wall thickening. There is ascites throughout the abdomen. An oval region of low attenuation projected over the right hepatic lobe on axial image 27 is thought to be an abnormal gallbladder containing debris/ sludge with wall thickening and pericholecystic fluid. The remainder of the liver is normal. The spleen is unremarkable. Probable adrenal hyperplasia. Small kidneys consistent with end-stage renal disease. The pancreas is normal. Atherosclerosis is seen in the abdominal aorta. No obvious adenopathy although evaluation is limited. The pelvis demonstrates no adenopathy or mass. The bladder is decompressed. No masses noted. No evidence of bony metastatic disease. No acute bony abnormalities. IMPRESSION: 1. There is free air in the abdomen consistent with bowel perforation. 2. There is a small bowel obstruction. Evaluation for a transition point is difficult given the lack of intra-abdominal fat and contrast. Two potential sites of transition are identified. The first is the right anterior lateral hernia. The loop of bowel exiting the hernia appears to be relatively decompressed compared to the more proximal dilated bowel. Additionally, there is a tubular abnormality in the right lower quadrant described above. This is favored to represent abnormal loops of bowel with wall thickening. This is another persist potential site of obstruction. This abnormality is thought to be less likely to represent a mass. 3. The gallbladder is abnormal in appearance with possible sludge, wall thickening, and pericholecystic fluid. An ultrasound could better evaluate. The findings were called to Dr. Rex Kras Electronically Signed   By: Dorise Bullion III M.D    On: 12/20/2015 19:32   Dg Chest Port 1 View  Result Date: 12/24/2015 CLINICAL DATA:  Tachypnea, abdominal pain, abnormal EKG today. History of hypertension, diabetes, chronic kidney disease. EXAM: PORTABLE CHEST 1 VIEW COMPARISON:  None. FINDINGS: Heart size is upper normal. Atherosclerotic changes noted at the aortic arch. Study is hypoinspiratory with crowding of the perihilar bronchovascular markings. Elevated right hemidiaphragm obscures visualization of the right lung base. Visualized portions of the lungs are clear. No evidence of pneumonia. No pleural effusion or pneumothorax seen. Osseous structures about the chest are unremarkable. Dialysis catheter appears adequately positioned with tip at the level of the mid SVC. IMPRESSION: 1. Low lung volumes. No acute findings. No evidence of pneumonia. No evidence of CHF. 2. Aortic atherosclerosis. Electronically Signed   By: Franki Cabot M.D.   On: 12/17/2015 16:39    ROS: Unable to obtain as patient is intubated  Blood pressure (!) 178/49, pulse 63, temperature 100.3  F (37.9 C), temperature source Axillary, resp. rate (!) 22, weight 65 kg (143 lb 5 oz), SpO2 92 %. General appearance: Sedated on vent Eyes: conjunctivae/corneas clear. PERRL, EOM's intact. Fundi benign. Neck: no adenopathy, no carotid bruit, no JVD, supple, symmetrical, trachea midline and thyroid not enlarged, symmetric, no tenderness/mass/nodules Resp: diminished breath sounds bibasilar Cardio: regular rate and rhythm, S1, S2 normal, no murmur, click, rub or gallop GI: Distended, wound is dressed, ostomy in place Extremities: extremities normal, atraumatic, no cyanosis or edema Incision/Wound: Left upper arm AV fistula with good bruit  Assessment/Plan: 81 year old dialysis patient status post ileal and colon resection for ischemic bowel with ostomy  1 hernia with ischemic bowel - status post operative intervention with ostomy. On broad-spectrum antibiotics. Per surgery and  primary team  2 ESRD: Normally on TTS schedule via a PermCath. There are no absolute indications for dialysis today. I have written orders for her to be done in the intensive care unit first thing tomorrow morning. I will, however check a potassium later today in case we need to do the dialysis sooner. AV fistula is still patent  3 Hypertension: Blood pressure of anything is high. No obvious peripheral edema but all of likely is volume overloaded with 4 L in yesterday and minimal out. Minimize IVFS I have discussed with nursing  4. 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 Last hemoglobin a kidney Center was 11.8- now 8.1. Received Mircera 7/20 - will not give here now.  Supportive care and transfuse as needed per primary teams  5. Metabolic Bone Disease: Last numbers a kidney center noted above. Is on Renvela for phosphate binding and calcitriol 1.75 every 8 hours day will hold until is eating   Rithika Seel A 01/04/2016, 7:11 AM

## 2016-01-04 NOTE — Progress Notes (Signed)
Hypoglycemic Event  CBG: 69  Treatment: D50 IV 25 mL  Symptoms: None  Follow-up CBG: Time:2020 CBG Result:132  Possible Reasons for Event: Unknown  Comments/MD notified:protocol followed    Shelly Cain

## 2016-01-04 NOTE — Op Note (Addendum)
12/25/2015  12:07 AM  PATIENT:  Shelly Cain  81 y.o. female  PRE-OPERATIVE DIAGNOSIS:  Perforated viscous  POST-OPERATIVE DIAGNOSIS:  INTERNAL HERNIA WITH ISCHEMIC BOWEL  PROCEDURE:  Procedure(s): EXPLORATORY LAPAROTOMY (N/A) DISTAL ILEUM AND COLON RESECTION RIGHT (Right) TERMINAL ILEOSTOMY WITH HARTMAN POUCH (Right)  SURGEON:  Surgeon(s) and Role:    * Ralene Ok, MD - Primary  ANESTHESIA:   general  EBL:50cc  Total I/O In: 750 [I.V.:250; IV Piggyback:500] Out: -   BLOOD ADMINISTERED:none  DRAINS: none   LOCAL MEDICATIONS USED:  NONE  SPECIMEN:  Source of Specimen:  Distal ileum, right colon  DISPOSITION OF SPECIMEN:  PATHOLOGY  COUNTS:  YES  TOURNIQUET:  * No tourniquets in log *  DICTATION: .Dragon Dictation Indications for procedure: Patient is a 81 year old female who arrived to the [facility] ER for 2 days of abdominal pain. Patient went CT scan which revealed free air, and congested-looking small bowel. Patient on exam peritonitis and was taken to the operating room for emergent to laparotomy.  Details of procedure: After the patient was consented she was a backseat preoperative and placed in supine position with bilateral SCDs in place. Patient underwent general endotracheal anesthesia. Patient for catheter placement. Patient was prepped and draped in standard fashion. A timeout was called all facts verified.  A 10 blade was used to make an upper midline incision. Cautery was used to maintain hemostasis and dissection was taken down to the anterior fascia. As was elevated between 2 Coker clamps. The peritoneum was elevated between 2 hemostats and sharply cut. Upon entering the abdominal cavity there was some bloody ascites. We then extended the fascial opening the length of the skin incision. At this time is evident there was some ischemic bowel to the right portion of the abdominal cavity. I proceeded to eviscerate the ischemic portions of the bowel. It  appeared to consist of the distal ileum. Upon further eviscerate the small bowel appeared that the right colon was also involved. There was a internal hernia secondary to adhesions of the mesentery of the right colon to the lateral abdominal wall. There did appear to be a previous anastomosis with staple in this area. The entire small bowel was eviscerated the small bowel was then run from the ligament of Treitz distally to the terminal ileum. Upon measuring the small bowel there appeared to be approximately 120 cm of small bowel not involved. At this time a portion of the small bowel proximal to the ischemia was chosen for transection. A 75 GIA stapler was used to transect small bowel. The proximal side of the mid colic vessels were chosen for transection of the transverse colon. At this time a mixture device was used to ligate the distal ileum and right colon. Once this was resected this was sent to pathology.  At this time the abdominal cavity was irrigated out with multiple liters of sterile saline. At this time was able to run the bowel from the ligament of Treitz distally. There was no further signs of ischemia. At this time the area was chosen the right lower quadrant through the rectus sheath to bring out an terminal ileostomy. A skin incision was made and dissection was taken down to the anterior fascia. A cruciate incision was made at the anterior fascia. The excess muscle were retracted laterally. The peritoneum was then incised. A 2 fingerbreadth were able to be passed easily through the fascial incision. This time the ileum was brought up through the fascia. The previous right  lower quadrant hernia site was reapproximated using oh Novafils in a figure-of-eight fashion. The hernia sac was connected to the midline subcutaneous tissues tissue with cautery. The hernia sac was not excised.  At this time #1 PDS single stranded stitch was used to reapproximate the fascia in a running fashion.  At this  time the stapler was removed from the terminal ileum. There was good blood flow. The ileostomy is matured in a Brooke fashion using 3-0 Vicryls in interrupted fashion. The last was anchored to the fascia prior to this using 3-0 silk.  The subcutaneous tissue was packed with saline soaked Kerlix. The skin was left open. Ileostomy device was placed around the ileostomy. The patient tolerated procedure well was taken to the ICU intubated and in adequate condition.    PLAN OF CARE: Admit to inpatient   PATIENT DISPOSITION:  ICU - intubated and hemodynamically stable.   Delay start of Pharmacological VTE agent (>24hrs) due to surgical blood loss or risk of bleeding: yes

## 2016-01-04 NOTE — Progress Notes (Signed)
Utilization review completed.  

## 2016-01-04 NOTE — Progress Notes (Signed)
PULMONARY / CRITICAL CARE MEDICINE   Name: Shelly Cain MRN: [id number] DOB: 1934-07-01    ADMISSION DATE:  01/01/2016 CONSULTATION DATE:  12/21/2015  REFERRING MD:  EDP  CHIEF COMPLAINT:  Abdominal pain  HISTORY OF PRESENT ILLNESS:   [name] is a 69F with PMH significant for ESRD on iHD TThSa, HTN, hx colon cancer, diabetes, and dementia who presented to the ED with abd pain on HD  A CT abdomen was obtained that showed free air in the abdomen with a likely SBO with transition point - no clear site of perforation, however. Her exam was notable for diffuse tenderness with guarding, but no rebound. LA was 8.25 on presentation. Underwent  exploratory laparotomy for bowel necrosis and required bowel resection, extensive washout and end ileostomy.   SUBJECTIVE:  Pain requiring freq fent pushes Now sedated Hypertensive Low gr febrile   VITAL SIGNS: BP (!) 163/53   Pulse 99   Temp 100.3 F (37.9 C) (Axillary)   Resp (!) 23   Wt 143 lb 5 oz (65 kg)   SpO2 100%   BMI 23.13 kg/m   HEMODYNAMICS:    VENTILATOR SETTINGS: Vent Mode: PRVC FiO2 (%):  [40 %-60 %] 40 % Set Rate:  [16 bmp] 16 bmp Vt Set:  [500 mL] 500 mL PEEP:  [5 cmH20] 5 cmH20 Plateau Pressure:  [18 cmH20-24 cmH20] 24 cmH20  INTAKE / OUTPUT: I/O last 3 completed shifts: In: [id number] [I.V.:4050; IV Piggyback:600] Out: 76 [Blood:75]  PHYSICAL EXAMINATION:  General Well nourished, well developed, intubated  HEENT No gross abnormalities. ETT/OGT in place  Pulmonary Clear to auscultation bilaterally with no wheezes, rales or ronchi. Good effort, symmetrical expansion.   Cardiovascular Normal rate, regular rhythm. S1, s2. No m/r/g. Distal pulses palpable.  Abdomen Soft. Bulky dressing in place c/d/i. Stoma pink with scant serosanguinous drainage. Mild tenderness to palpation. +BS.   Musculoskeletal Grossly normal.   Lymphatics No cervical, supraclavicular or axillary adenopathy.   Neurologic Grossly intact. No  focal deficits.   Skin/Integuement No rash, no cyanosis, no clubbing. No edema. Right upper chest PermCath.      LABS:  BMET  Recent Labs Lab 12/22/2015 1544 01/08/2016 1632 01/04/16 0415  NA 139 135 138  K 5.3* 5.7* 5.0  CL 96* 96* 101  CO2 24  --  24  BUN 44* 57* 54*  CREATININE 4.60* 4.90* 4.62*  GLUCOSE 110* 98 88    Electrolytes  Recent Labs Lab 01/10/2016 1544 01/04/16 0415  CALCIUM 9.5 8.1*    CBC  Recent Labs Lab 12/25/2015 1544 12/13/2015 1632 01/04/16 0415  WBC 7.3  --  3.0*  HGB 9.7* 11.6* 8.1*  HCT 30.5* 34.0* 25.7*  PLT 147*  --  112*    Coag's No results for input(s): APTT, INR in the last 168 hours.  Sepsis Markers  Recent Labs Lab 12/22/2015 1552 12/21/2015 1851  LATICACIDVEN 8.25* 4.43*    ABG  Recent Labs Lab 01/04/16 0211  PHART 7.397  PCO2ART 41.8  PO2ART 263.0*    Liver Enzymes  Recent Labs Lab 01/02/2016 1544  AST 50*  ALT 39  ALKPHOS 39  BILITOT 0.6  ALBUMIN 3.8    Cardiac Enzymes No results for input(s): TROPONINI, PROBNP in the last 168 hours.  Glucose  Recent Labs Lab 01/04/16 0121 01/04/16 0235 01/04/16 0303 01/04/16 0408 01/04/16 0643 01/04/16 0657  GLUCAP 76 71 72 100* 65 162*    Imaging Ct Abdomen Pelvis Wo Contrast  Result Date: 12/16/2015 CLINICAL DATA:  Lower abdominal pain and lactic acidosis. End-stage renal disease. History of colon cancer. EXAM: CT ABDOMEN AND PELVIS WITHOUT CONTRAST TECHNIQUE: Multidetector CT imaging of the abdomen and pelvis was performed following the standard protocol without IV contrast. COMPARISON:  None. FINDINGS: There is a small right effusion. Mild bibasilar atelectasis. No other acute abnormalities in the lower chest. There is free air throughout the abdomen. The colon is normal in caliber and much smaller than the small bowel. The stomach is normal in appearance. The small bowel loops are diffusely distended. Two potential transition points are identified. First, there  is a hernia in the right anterior lateral abdomen as seen on axial image 56. There appears to be an abrupt transition point between the dilated loops of small bowel entering the hernia and a nondilated loop of bowel leaving the hernia. I suspect this is the cause of the small bowel obstruction. There is also an a tubular masslike process in the right lower quadrant, just inferior to the anastomosis best seen on axial images 50 through 61. It is possible that this is a mass. However, I suspect it is more likely to represent abnormal small bowel loops with wall thickening. There is ascites throughout the abdomen. An oval region of low attenuation projected over the right hepatic lobe on axial image 27 is thought to be an abnormal gallbladder containing debris/ sludge with wall thickening and pericholecystic fluid. The remainder of the liver is normal. The spleen is unremarkable. Probable adrenal hyperplasia. Small kidneys consistent with end-stage renal disease. The pancreas is normal. Atherosclerosis is seen in the abdominal aorta. No obvious adenopathy although evaluation is limited. The pelvis demonstrates no adenopathy or mass. The bladder is decompressed. No masses noted. No evidence of bony metastatic disease. No acute bony abnormalities. IMPRESSION: 1. There is free air in the abdomen consistent with bowel perforation. 2. There is a small bowel obstruction. Evaluation for a transition point is difficult given the lack of intra-abdominal fat and contrast. Two potential sites of transition are identified. The first is the right anterior lateral hernia. The loop of bowel exiting the hernia appears to be relatively decompressed compared to the more proximal dilated bowel. Additionally, there is a tubular abnormality in the right lower quadrant described above. This is favored to represent abnormal loops of bowel with wall thickening. This is another persist potential site of obstruction. This abnormality is thought  to be less likely to represent a mass. 3. The gallbladder is abnormal in appearance with possible sludge, wall thickening, and pericholecystic fluid. An ultrasound could better evaluate. The findings were called to Dr. Rex Kras Electronically Signed   By: Dorise Bullion III M.D   On: 12/23/2015 19:32   Dg Chest Port 1 View  Result Date: 01/04/2016 CLINICAL DATA:  Tachypnea, abdominal pain, abnormal EKG today. History of hypertension, diabetes, chronic kidney disease. EXAM: PORTABLE CHEST 1 VIEW COMPARISON:  None. FINDINGS: Heart size is upper normal. Atherosclerotic changes noted at the aortic arch. Study is hypoinspiratory with crowding of the perihilar bronchovascular markings. Elevated right hemidiaphragm obscures visualization of the right lung base. Visualized portions of the lungs are clear. No evidence of pneumonia. No pleural effusion or pneumothorax seen. Osseous structures about the chest are unremarkable. Dialysis catheter appears adequately positioned with tip at the level of the mid SVC. IMPRESSION: 1. Low lung volumes. No acute findings. No evidence of pneumonia. No evidence of CHF. 2. Aortic atherosclerosis. Electronically Signed   By: Franki Cabot M.D.   On: 12/16/2015  16:39     STUDIES:  CT abd 7/22 >> SBO, free air  CULTURES:   ANTIBIOTICS: Zosyn 7/22 >> Vancomycin 7/22 >>  SIGNIFICANT EVENTS: Distal ileum and colon resection; end ileostomy w/ [place] pouch  LINES/TUBES: PIV  DISCUSSION: Ms. Deltoro is a 75F w/ PMH significant for ESRD on iHD TTSa, DMII, dementia, HTN, hx colon ca, who presented with acute small bowel obstruction and perforation and was taken urgently to the OR for exploratory laparotomy with bowel resection and end ileostomy.   ASSESSMENT / PLAN:  GASTROINTESTINAL A:   Acute SBO w/ perforation s/p terminal ileum and colon resection with end ileostomy P:   Vanc/Zosyn NPO Monitor ostomy output Pain control  PULMONARY A: Intubated for  procedure P:   SBT with goal  extubation   CARDIOVASCULAR A:  Post-procedural hypotension requiring neosynephrine Hx hypertension P:  Off  phenylephrine Use hydrallazine prn  RENAL A:   ESRD on iHD TTSa P:   Nephrology following, HD planned for Mon am but may need sooner if K higher this pm Monitor lytes   HEMATOLOGIC A:   Acute blood loss anemia - Hb drop from 11 to 8 P:  Monitor & transfuse on HD if <7  INFECTIOUS A:   Concern for evolving intraabdominal sepsis s/p bowel perforation P:   Zosyn Dc vanc  ENDOCRINE A:   Hypoglycemia post-operatively (iatrogenic) Hx DMII  Hypothyroidism  P:   Follow CBG Maintain euglycemia Continue synthroid  NEUROLOGIC A:   Pain P:   RASS goal: 0 Morphine prn    FAMILY  - Updates: No family at bedside.   - Inter-disciplinary family meet or Palliative Care meeting due by:  day 7  Cc time x 32 m  Kara Mead MD. [facility]. [facility] Pager [phone] If no response call [phone]   01/04/2016    01/04/2016, 7:45 AM

## 2016-01-04 NOTE — Progress Notes (Signed)
Hypoglycemic Event  CBG: 16 left hand/19 right hand  Treatment: D50 IV 50 mL  Symptoms: pt received from OR/still under sedation from versed she received prior to transport  Follow-up CBG: Time:0107 CBG Result:24  Possible Reasons for Event: Unknown  Comments/MD notified:Dr Deterding made aware; orders obtained    Jerline Pain, Mike Craze, [place]

## 2016-01-04 NOTE — Progress Notes (Signed)
1 Day Post-Op  Subjective: On vent  Objective: Vital signs in last 24 hours: Temp:  [98.8 F (37.1 C)-100.3 F (37.9 C)] 100.3 F (37.9 C) (07/23 0400) Pulse Rate:  [40-109] 99 (07/23 0732) Resp:  [15-41] 27 (07/23 0800) BP: (110-210)/(49-118) 134/60 (07/23 0800) SpO2:  [92 %-100 %] 100 % (07/23 0800) FiO2 (%):  [40 %-60 %] 40 % (07/23 0800) Weight:  [65 kg (143 lb 5 oz)] 65 kg (143 lb 5 oz) (07/22 1631)    Intake/Output from previous day: 07/22 0701 - 07/23 0700 In: 4650 [I.V.:4050; IV Piggyback:600] Out: 75 [Blood:75] Intake/Output this shift: Total I/O In: 76.3 [I.V.:46.3; NG/GT:30] Out: 150 [Emesis/NG output:150]  General appearance: no distress Resp: clear to auscultation bilaterally Cardio: S1, S2 normal GI: soft, quiet, wound clean with wtd, ileostomy dark pink with no output  Lab Results:   Recent Labs  12/16/2015 1544 12/30/2015 1632 01/04/16 0415  WBC 7.3  --  3.0*  HGB 9.7* 11.6* 8.1*  HCT 30.5* 34.0* 25.7*  PLT 147*  --  112*   BMET  Recent Labs  01/01/2016 1544 12/28/2015 1632 01/04/16 0415  NA 139 135 138  K 5.3* 5.7* 5.0  CL 96* 96* 101  CO2 24  --  24  GLUCOSE 110* 98 88  BUN 44* 57* 54*  CREATININE 4.60* 4.90* 4.62*  CALCIUM 9.5  --  8.1*   PT/INR No results for input(s): LABPROT, INR in the last 72 hours. ABG  Recent Labs  12/23/2015 1852 01/04/16 0211  PHART  --  7.397  HCO3 29.2* 25.7*    Studies/Results: Ct Abdomen Pelvis Wo Contrast  Result Date: 01/07/2016 CLINICAL DATA:  Lower abdominal pain and lactic acidosis. End-stage renal disease. History of colon cancer. EXAM: CT ABDOMEN AND PELVIS WITHOUT CONTRAST TECHNIQUE: Multidetector CT imaging of the abdomen and pelvis was performed following the standard protocol without IV contrast. COMPARISON:  None. FINDINGS: There is a small right effusion. Mild bibasilar atelectasis. No other acute abnormalities in the lower chest. There is free air throughout the abdomen. The colon is normal  in caliber and much smaller than the small bowel. The stomach is normal in appearance. The small bowel loops are diffusely distended. Two potential transition points are identified. First, there is a hernia in the right anterior lateral abdomen as seen on axial image 56. There appears to be an abrupt transition point between the dilated loops of small bowel entering the hernia and a nondilated loop of bowel leaving the hernia. I suspect this is the cause of the small bowel obstruction. There is also an a tubular masslike process in the right lower quadrant, just inferior to the anastomosis best seen on axial images 50 through 61. It is possible that this is a mass. However, I suspect it is more likely to represent abnormal small bowel loops with wall thickening. There is ascites throughout the abdomen. An oval region of low attenuation projected over the right hepatic lobe on axial image 27 is thought to be an abnormal gallbladder containing debris/ sludge with wall thickening and pericholecystic fluid. The remainder of the liver is normal. The spleen is unremarkable. Probable adrenal hyperplasia. Small kidneys consistent with end-stage renal disease. The pancreas is normal. Atherosclerosis is seen in the abdominal aorta. No obvious adenopathy although evaluation is limited. The pelvis demonstrates no adenopathy or mass. The bladder is decompressed. No masses noted. No evidence of bony metastatic disease. No acute bony abnormalities. IMPRESSION: 1. There is free air in the  abdomen consistent with bowel perforation. 2. There is a small bowel obstruction. Evaluation for a transition point is difficult given the lack of intra-abdominal fat and contrast. Two potential sites of transition are identified. The first is the right anterior lateral hernia. The loop of bowel exiting the hernia appears to be relatively decompressed compared to the more proximal dilated bowel. Additionally, there is a tubular abnormality in the  right lower quadrant described above. This is favored to represent abnormal loops of bowel with wall thickening. This is another persist potential site of obstruction. This abnormality is thought to be less likely to represent a mass. 3. The gallbladder is abnormal in appearance with possible sludge, wall thickening, and pericholecystic fluid. An ultrasound could better evaluate. The findings were called to Dr. Rex Kras Electronically Signed   By: Dorise Bullion III M.D   On: 01/08/2016 19:32   Dg Chest Port 1 View  Result Date: 12/29/2015 CLINICAL DATA:  Tachypnea, abdominal pain, abnormal EKG today. History of hypertension, diabetes, chronic kidney disease. EXAM: PORTABLE CHEST 1 VIEW COMPARISON:  None. FINDINGS: Heart size is upper normal. Atherosclerotic changes noted at the aortic arch. Study is hypoinspiratory with crowding of the perihilar bronchovascular markings. Elevated right hemidiaphragm obscures visualization of the right lung base. Visualized portions of the lungs are clear. No evidence of pneumonia. No pleural effusion or pneumothorax seen. Osseous structures about the chest are unremarkable. Dialysis catheter appears adequately positioned with tip at the level of the mid SVC. IMPRESSION: 1. Low lung volumes. No acute findings. No evidence of pneumonia. No evidence of CHF. 2. Aortic atherosclerosis. Electronically Signed   By: Franki Cabot M.D.   On: 12/19/2015 16:39    Anti-infectives: Anti-infectives    Start     Dose/Rate Route Frequency Ordered Stop   12/29/2015 2200  piperacillin-tazobactam (ZOSYN) IVPB 2.25 g     2.25 g 100 mL/hr over 30 Minutes Intravenous Every 8 hours 12/18/2015 1654     01/10/2016 1715  vancomycin (VANCOCIN) 500 mg in sodium chloride 0.9 % 100 mL IVPB     500 mg 100 mL/hr over 60 Minutes Intravenous  Once 01/02/2016 1654 12/30/2015 1835   12/27/2015 1615  piperacillin-tazobactam (ZOSYN) IVPB 3.375 g     3.375 g 100 mL/hr over 30 Minutes Intravenous  Once 12/16/2015 1613  12/26/2015 1730   12/15/2015 1615  vancomycin (VANCOCIN) IVPB 1000 mg/200 mL premix     1,000 mg 200 mL/hr over 60 Minutes Intravenous  Once 12/19/2015 1613 12/16/2015 1825      Assessment/Plan: s/p Procedure(s): EXPLORATORY LAPAROTOMY (N/A) DISTAL ILEUM AND COLON RESECTION RIGHT (Right) TERMINAL ILEOSTOMY WITH HARTMANS POUCH (Right) POD#1 VDRF - wean to extubate per ccm CRF - HD per renal Hypoglycemia - D10 at 30cc/h and follow CBG FEN - await bowel function VTE - PAS, no lovenox until Hb stable  LOS: 1 day    Tashiba Timoney E 01/04/2016

## 2016-01-04 NOTE — Significant Event (Signed)
Hypoglycemic Event  CBG: 59  Treatment: D50 IV 50 mL  Symptoms: Sweaty  Follow-up CBG: Time: 1220 CBG Result: 146  Possible Reasons for Event: Inadequate meal intake   Shelly Cain

## 2016-01-04 NOTE — Progress Notes (Signed)
Hypoglycemic Event  CBG: 65  Treatment: D50 IV 50 mL  Symptoms: None  Follow-up CBG: Time:0700 CBG Result:162  Possible Reasons for Event: Unknown  Comments/MD notified:medication administered per orders    Shelly Cain

## 2016-01-04 NOTE — Progress Notes (Signed)
Pt moved to ICU bed.

## 2016-01-04 NOTE — Transfer of Care (Signed)
Immediate Anesthesia Transfer of Care Note  Patient: Shelly Cain  Procedure(s) Performed: Procedure(s): EXPLORATORY LAPAROTOMY (N/A) DISTAL ILEUM AND COLON RESECTION RIGHT (Right) TERMINAL ILEOSTOMY WITH HARTMAN POUCH (Right)  Patient Location: SICU  Anesthesia Type:General  Level of Consciousness: Patient remains intubated per anesthesia plan  Airway & Oxygen Therapy: Patient remains intubated per anesthesia plan and Patient placed on Ventilator (see vital sign flow sheet for setting)  Post-op Assessment: Report given to RN and Post -op Vital signs reviewed and stable  Post vital signs: Reviewed and stable  Last Vitals:  Vitals:   01/04/16 0430 01/04/16 0445  BP: (!) 158/118 (!) 152/53  Pulse:    Resp: 20 15  Temp:      Last Pain:  Vitals:   01/04/16 0400  TempSrc: Axillary  PainSc:          Complications: No apparent anesthesia complications

## 2016-01-05 ENCOUNTER — Encounter (HOSPITAL_COMMUNITY): Payer: Self-pay | Admitting: General Surgery

## 2016-01-05 ENCOUNTER — Other Ambulatory Visit (HOSPITAL_COMMUNITY): Payer: Medicare (Managed Care)

## 2016-01-05 ENCOUNTER — Encounter (HOSPITAL_COMMUNITY): Payer: Medicare (Managed Care)

## 2016-01-05 ENCOUNTER — Ambulatory Visit: Payer: Medicare (Managed Care) | Admitting: Surgery

## 2016-01-05 ENCOUNTER — Inpatient Hospital Stay (HOSPITAL_COMMUNITY): Payer: Medicare (Managed Care)

## 2016-01-05 DIAGNOSIS — J9601 Acute respiratory failure with hypoxia: Secondary | ICD-10-CM

## 2016-01-05 DIAGNOSIS — A419 Sepsis, unspecified organism: Principal | ICD-10-CM

## 2016-01-05 DIAGNOSIS — I469 Cardiac arrest, cause unspecified: Secondary | ICD-10-CM

## 2016-01-05 DIAGNOSIS — R6521 Severe sepsis with septic shock: Secondary | ICD-10-CM

## 2016-01-05 DIAGNOSIS — Z7189 Other specified counseling: Secondary | ICD-10-CM

## 2016-01-05 DIAGNOSIS — Z515 Encounter for palliative care: Secondary | ICD-10-CM

## 2016-01-05 LAB — GLUCOSE, CAPILLARY
GLUCOSE-CAPILLARY: 107 mg/dL — AB (ref 65–99)
GLUCOSE-CAPILLARY: 140 mg/dL — AB (ref 65–99)
GLUCOSE-CAPILLARY: 19 mg/dL — AB (ref 65–99)
GLUCOSE-CAPILLARY: 84 mg/dL (ref 65–99)
Glucose-Capillary: 101 mg/dL — ABNORMAL HIGH (ref 65–99)
Glucose-Capillary: 112 mg/dL — ABNORMAL HIGH (ref 65–99)
Glucose-Capillary: 43 mg/dL — CL (ref 65–99)
Glucose-Capillary: 77 mg/dL (ref 65–99)

## 2016-01-05 LAB — CBC
HEMATOCRIT: 22 % — AB (ref 36.0–46.0)
HEMATOCRIT: 22.9 % — AB (ref 36.0–46.0)
HEMOGLOBIN: 7.3 g/dL — AB (ref 12.0–15.0)
Hemoglobin: 7.4 g/dL — ABNORMAL LOW (ref 12.0–15.0)
MCH: 28.5 pg (ref 26.0–34.0)
MCH: 28.1 pg (ref 26.0–34.0)
MCHC: 33.2 g/dL (ref 30.0–36.0)
MCHC: 32.3 g/dL (ref 30.0–36.0)
MCV: 85.9 fL (ref 78.0–100.0)
MCV: 87.1 fL (ref 78.0–100.0)
Platelets: 137 10*3/uL — ABNORMAL LOW (ref 150–400)
Platelets: 145 10*3/uL — ABNORMAL LOW (ref 150–400)
RBC: 2.56 MIL/uL — ABNORMAL LOW (ref 3.87–5.11)
RBC: 2.63 MIL/uL — ABNORMAL LOW (ref 3.87–5.11)
RDW: 17 % — ABNORMAL HIGH (ref 11.5–15.5)
RDW: 17 % — AB (ref 11.5–15.5)
WBC: 8.4 10*3/uL (ref 4.0–10.5)
WBC: 10.9 10*3/uL — ABNORMAL HIGH (ref 4.0–10.5)

## 2016-01-05 LAB — POCT I-STAT 3, ART BLOOD GAS (G3+)
BICARBONATE: 24.2 meq/L — AB (ref 20.0–24.0)
O2 Saturation: 100 %
TCO2: 25 mmol/L (ref 0–100)
pCO2 arterial: 34 mmHg — ABNORMAL LOW (ref 35.0–45.0)
pH, Arterial: 7.46 — ABNORMAL HIGH (ref 7.350–7.450)
pO2, Arterial: 535 mmHg — ABNORMAL HIGH (ref 80.0–100.0)

## 2016-01-05 LAB — BASIC METABOLIC PANEL
ANION GAP: 16 — AB (ref 5–15)
Anion gap: 16 — ABNORMAL HIGH (ref 5–15)
BUN: 74 mg/dL — ABNORMAL HIGH (ref 6–20)
BUN: 26 mg/dL — ABNORMAL HIGH (ref 6–20)
CALCIUM: 12.2 mg/dL — AB (ref 8.9–10.3)
CHLORIDE: 99 mmol/L — AB (ref 101–111)
CO2: 21 mmol/L — AB (ref 22–32)
CO2: 22 mmol/L (ref 22–32)
CREATININE: 5.77 mg/dL — AB (ref 0.44–1.00)
CREATININE: 2.56 mg/dL — AB (ref 0.44–1.00)
Calcium: 8.3 mg/dL — ABNORMAL LOW (ref 8.9–10.3)
Chloride: 100 mmol/L — ABNORMAL LOW (ref 101–111)
GFR calc Af Amer: 19 mL/min — ABNORMAL LOW (ref >60)
GFR calc non Af Amer: 6 mL/min — ABNORMAL LOW (ref >60)
GFR calc non Af Amer: 17 mL/min — ABNORMAL LOW (ref >60)
GFR, EST AFRICAN AMERICAN: 7 mL/min — AB (ref >60)
GLUCOSE: 151 mg/dL — AB (ref 65–99)
GLUCOSE: 67 mg/dL (ref 65–99)
Potassium: 6.3 mmol/L (ref 3.5–5.1)
Potassium: 4.8 mmol/L (ref 3.5–5.1)
Sodium: 136 mmol/L (ref 135–145)
Sodium: 138 mmol/L (ref 135–145)

## 2016-01-05 LAB — PREPARE RBC (CROSSMATCH)

## 2016-01-05 LAB — TROPONIN I: TROPONIN I: 1 ng/mL — AB (ref <0.03)

## 2016-01-05 LAB — ABO/RH: ABO/RH(D): A POS

## 2016-01-05 MED ORDER — LIDOCAINE HCL (PF) 1 % IJ SOLN: 5.0000 mL | INTRAMUSCULAR | Status: DC | PRN

## 2016-01-05 MED ORDER — FENTANYL CITRATE (PF) 100 MCG/2ML IJ SOLN
25.0000 ug | INTRAMUSCULAR | Status: DC | PRN
  Administered 2016-01-09: 25 ug via INTRAVENOUS
  Filled 2016-01-05 (×2): qty 2

## 2016-01-05 MED ORDER — SODIUM CHLORIDE 0.9 % IV SOLN: 100.0000 mL | INTRAVENOUS | Status: DC | PRN

## 2016-01-05 MED ORDER — INSULIN ASPART 100 UNIT/ML IV SOLN
10.0000 [IU] | Freq: Once | INTRAVENOUS | Status: AC
  Administered 2016-01-05: 10 [IU] via INTRAVENOUS

## 2016-01-05 MED ORDER — SODIUM CHLORIDE 0.9 % IV SOLN
Freq: Once | INTRAVENOUS | Status: AC
Start: 2016-01-05 — End: 2016-01-05
  Administered 2016-01-05: 10:00:00 via INTRAVENOUS

## 2016-01-05 MED ORDER — DEXTROSE 5 % IV SOLN
4.0000 ug/min | INTRAVENOUS | Status: DC
  Administered 2016-01-05: 10 ug/min via INTRAVENOUS
  Administered 2016-01-05: 4 ug/min via INTRAVENOUS
  Administered 2016-01-06: 12 ug/min via INTRAVENOUS
  Administered 2016-01-06: 8 ug/min via INTRAVENOUS
  Administered 2016-01-06: 5 ug/min via INTRAVENOUS
  Administered 2016-01-07: 4 ug/min via INTRAVENOUS
  Administered 2016-01-08: 7 ug/min via INTRAVENOUS
  Administered 2016-01-09: 15 ug/min via INTRAVENOUS
  Filled 2016-01-05 (×9): qty 4

## 2016-01-05 MED ORDER — HEPARIN SODIUM (PORCINE) 1000 UNIT/ML DIALYSIS: 1000.0000 [IU] | INTRAMUSCULAR | Status: DC | PRN

## 2016-01-05 MED ORDER — DEXTROSE 50 % IV SOLN
1.0000 | Freq: Once | INTRAVENOUS | Status: AC
  Administered 2016-01-05: 50 mL via INTRAVENOUS
  Filled 2016-01-05: qty 50

## 2016-01-05 MED ORDER — DEXTROSE 50 % IV SOLN
INTRAVENOUS | Status: AC
  Administered 2016-01-05: 50 mL
  Filled 2016-01-05: qty 50

## 2016-01-05 MED ORDER — PENTAFLUOROPROP-TETRAFLUOROETH EX AERO: 1.0000 "application " | INHALATION_SPRAY | CUTANEOUS | Status: DC | PRN

## 2016-01-05 MED ORDER — LIDOCAINE-PRILOCAINE 2.5-2.5 % EX CREA: 1.0000 "application " | TOPICAL_CREAM | CUTANEOUS | Status: DC | PRN

## 2016-01-05 MED ORDER — ALTEPLASE 2 MG IJ SOLR: 2.0000 mg | Freq: Once | INTRAMUSCULAR | Status: DC | PRN

## 2016-01-05 MED ORDER — EPINEPHRINE HCL 1 MG/ML IJ SOLN
0.5000 ug/min | INTRAMUSCULAR | Status: DC
  Filled 2016-01-05: qty 4

## 2016-01-05 MED FILL — Medication: Qty: 1 | Status: AC

## 2016-01-05 NOTE — Progress Notes (Signed)
CRITICAL VALUE ALERT  Critical value received:  Troponin 1.0 and Ca+ 12.3.  Date of notification:  01/05/2016  Time of notification:  1026 am  Critical value read back:Yes.    Nurse who received alert:  Eligha Kmetz  MD notified (1st page):  Loyola Mast, NP.   Time of first page:  1027  MD notified (2nd page):  Time of second page:  Responding MD:  Loyola Mast, NP.   Time MD responded:  [id number]. Shelly Cain was on the unit.

## 2016-01-05 NOTE — Consult Note (Addendum)
[facility] Nurse ostomy consult note Stoma type/location: RLQ, ileostomy New orders also to assess for VAC placement to the midline I will add this patient list and follow up tomorrow to determine if she is recovering post cardiac arrest to determine the need for pouch change and NPWT VAC dressing to the midline wound.  Stomal assessment/size: pouch intact and patient just coded, will await better understanding of her prognosis in the next day or so Peristomal assessment: not assessed  Treatment options for stomal/peristomal skin: NA Output liquid green stool  Ostomy pouching fecal containment device on this patient currently, supplies ordered however patient has coded this am and has now been made a DNR. Will follow along and change pouch in the next few days if needed.  Education provided: NA Enrolled patient in [place] program: No  WOC Nurse will follow along with you for continued support with ostomy teaching and care [place] RN,CWOCN [phone]

## 2016-01-05 NOTE — Progress Notes (Signed)
RN noticed patient in bradycardia (HR 40's), BP 44/33, PEA at 0935 and patient was also unresponsive while having dialysis at the bedside. Oxygen saturation was not picking up.   ACLS started at 0935, CCM and nurse practitioner at bedside. HD nurse unhooked patient from dialysis. Nephrology and MD at bedside and verbal order given to utilize HD cath for access to administer medications. Patient received 2 epi, 1 bicarb and 1 calcium. Normal saline 250 cc bolus given and levophed started.   ROSC (return of spontaneous circulation) occurred with return of right femoral pulse with doppler and carotid pulse. Family was updated of patient's condition by NP. See code sheet in the chart for further information. Will continue to monitor the patient very closely.

## 2016-01-05 NOTE — Code Documentation (Addendum)
PATIENT NAME: Shelly Cain MEDICAL RECORD NUMBER: [id number] Birthday: February 23, 1935  Age: 81 y.o. Admit Date: 01/07/2016  Provider: Kary Kos ACNP-BC  Indication:  PEA arrest Suspect metabolic and hyperkalemia etiology   Technical Description:   CPR performance duration: 9-10 minutes   Was defibrillation or cardioversion used ? no  Was external pacer placed ? no  Was patient intubated pre/post CPR ? Yes   Was transvenous pacer placed ? no  Medications Administered Include      Yes/no Amiodarone no  Atropine no  Calcium Yes   Epinephrine X 2 amps  Lidocaine no  Magnesium no  Norepinephrine yes  Phenylephrine no  Sodium bicarbonate Yes   Vasopression no   Evaluation  Final Status - Was patient successfully resuscitated ? yes  If successfully resuscitated - what is current rhythm ? SR If successfully resuscitated - what is current hemodynamic status ? shock  Miscellaneous Information Long d/w family. Care limitations established   Shelly Cain ACNP-BC [place] Pager # [phone] OR # [phone] if no answer  [name] 7/24/201711:49 AM   Rush Farmer, M.D. [facility]monary/Critical Care Medicine. Pager: [phone]. After hours pager: [phone].

## 2016-01-05 NOTE — Progress Notes (Signed)
  [facility] Progress Note   Subjective: arrested just now while on HD, PEA.   Vitals:   01/05/16 0830 01/05/16 0845 01/05/16 0900 01/05/16 0915  BP: 120/81 136/68 (!) 110/96 (!) 111/42  Pulse: (!) 108 (!) 110 (!) 109 92  Resp: (!) 25 (!) 29 (!) 23 17  Temp:      TempSrc:      SpO2:      Weight:        Inpatient medications: . sodium chloride   Intravenous Once  . antiseptic oral rinse  7 mL Mouth Rinse QID  . chlorhexidine gluconate (SAGE KIT)  15 mL Mouth Rinse BID  . enoxaparin (LOVENOX) injection  30 mg Subcutaneous Daily  . famotidine (PEPCID) IV  20 mg Intravenous Q24H  . insulin aspart  0-9 Units Subcutaneous Q4H  . isosorbide dinitrate  20 mg Oral TID  . piperacillin-tazobactam (ZOSYN)  IV  2.25 g Intravenous Q8H   . dextrose 30 mL/hr at 01/04/16 0749   sodium chloride, sodium chloride, alteplase, fentaNYL (SUBLIMAZE) injection, heparin, hydrALAZINE, lidocaine (PF), lidocaine-prilocaine, morphine injection, pentafluoroprop-tetrafluoroeth  Exam: Frail elderly AAF, "guppy" breathing on pressure support  No jvd Chest clear bilat RRR no mrg Abd soft ntnd ostomy bag RLQ Ext no edema R IJ cath Neuro is sedated , unresponsive on the vent  Dialysis: TTS [facility]   62kg   2/2 bath  Heparin yes      Assessment: 1  Ischemic bowel sp bowel / partial colon resection - per surg/ CCM 2  Cardiac arrest - this am, PEA, resuscitated 3  ESRD - on HD when arrested, hold further HD today 4  Hyperkalemia - mild , 6.3 this am 5  Chron mental illness- schizoaffective disorder w chron dementia 6  DM 7  Septic shock - on pressors post arrest this am 8  EOL - pt has a DNR at our outpatient HD unit  Plan - hold on further HD today.  Post arrest labs pending.   Kelly Splinter MD [facility] pager [phone]    cell [phone] 01/05/2016, 9:27 AM    Recent Labs Lab 01/04/16 0415 01/04/16 1617 01/05/16 0354  NA 138 136 136  K 5.0 5.9* 6.3*  CL 101  99* 99*  CO2 24 21* 21*  GLUCOSE 88 78 151*  BUN 54* 65* 74*  CREATININE 4.62* 5.36* 5.77*  CALCIUM 8.1* 8.3* 8.3*    Recent Labs Lab 12/31/2015 1544  AST 50*  ALT 39  ALKPHOS 39  BILITOT 0.6  PROT 6.7  ALBUMIN 3.8    Recent Labs Lab 01/08/2016 1544 12/16/2015 1632 01/04/16 0415 01/05/16 0354  WBC 7.3  --  3.0* 8.4  NEUTROABS 6.6  --   --   --   HGB 9.7* 11.6* 8.1* 7.3*  HCT 30.5* 34.0* 25.7* 22.0*  MCV 88.4  --  86.5 85.9  PLT 147*  --  112* 137*   Iron/TIBC/Ferritin/ %Sat No results found for: IRON, TIBC, FERRITIN, IRONPCTSAT

## 2016-01-05 NOTE — Progress Notes (Signed)
Oxygen saturations continue to not be able to pick up despite multiple new sites, cords, and new probes.  CCM NP, Marni Griffon, made aware of this. No new orders received. Will continue to monitor pt closely.

## 2016-01-05 NOTE — Procedures (Signed)
Arterial Catheter Insertion Procedure Note Chelcee Gulbrandson [id number] Apr 15, 1935  Procedure: Insertion of Arterial Catheter  Indications: Blood pressure monitoring and Frequent blood sampling  Procedure Details Consent: Risks of procedure as well as the alternatives and risks of each were explained to the (patient/caregiver).  Consent for procedure was not obtained as this was emergent  Time Out: Verified patient identification, verified procedure, site/side was marked, verified correct patient position, special equipment/implants available, medications/allergies/relevent history reviewed, required imaging and test results available.  Performed  Maximum sterile technique was used including antiseptics, cap, gloves, gown, hand hygiene, mask and sheet. Skin prep: Chlorhexidine; local anesthetic administered 20 gauge catheter was inserted into left femoral artery using the Seldinger technique.  Evaluation Blood flow good; BP tracing good. Complications: No apparent complications.   [name] 01/05/2016 Erick Colace ACNP-BC [place] Pager # [phone] OR # [phone] if no answer  Rush Farmer, M.D. [facility] Pulmonary/Critical Care Medicine. Pager: [phone]. After hours pager: [phone].

## 2016-01-05 NOTE — Progress Notes (Addendum)
2 Days Post-Op  Subjective: Patient had PEA arrest in the last hour - responded to CPR Received one unit PRBC on dialysis this morning Remains intubated on Levophed  Objective: Vital signs in last 24 hours: Temp:  [99.1 F (37.3 C)-100.4 F (38 C)] 99.7 F (37.6 C) (07/24 0720) Pulse Rate:  [34-110] 92 (07/24 0915) Resp:  [16-32] 17 (07/24 0915) BP: (102-202)/(34-96) 111/42 (07/24 0915) SpO2:  [98 %-100 %] 100 % (07/24 0800) FiO2 (%):  [40 %-100 %] 100 % (07/24 0945) Weight:  [64.4 kg (141 lb 15.6 oz)-67.5 kg (148 lb 13 oz)] 67.5 kg (148 lb 13 oz) (07/24 0720) Last BM Date:  (unknown)  Intake/Output from previous day: 07/23 0701 - 07/24 0700 In: 946.3 [I.V.:706.3; NG/GT:90; IV Piggyback:150] Out: 625 [Emesis/NG output:300; Stool:325] Intake/Output this shift: No intake/output data recorded.  General appearance: Frail; elderly; intubated Resp: clear to auscultation bilaterally Cardio: regular rate and rhythm, S1, S2 normal, no murmur, click, rub or gallop GI: soft; RLQ ileostomy functioning Lower midline wound - open; clean; minimal drainage  Lab Results:   Recent Labs  01/05/16 0354 01/05/16 0945  WBC 8.4 10.9*  HGB 7.3* 7.4*  HCT 22.0* 22.9*  PLT 137* 145*   BMET  Recent Labs  01/04/16 1617 01/05/16 0354  NA 136 136  K 5.9* 6.3*  CL 99* 99*  CO2 21* 21*  GLUCOSE 78 151*  BUN 65* 74*  CREATININE 5.36* 5.77*  CALCIUM 8.3* 8.3*   PT/INR No results for input(s): LABPROT, INR in the last 72 hours. ABG  Recent Labs  01/04/16 0211 01/05/16 1010  PHART 7.397 7.460*  HCO3 25.7* 24.2*    Studies/Results: Ct Abdomen Pelvis Wo Contrast  Result Date: 12/18/2015 CLINICAL DATA:  Lower abdominal pain and lactic acidosis. End-stage renal disease. History of colon cancer. EXAM: CT ABDOMEN AND PELVIS WITHOUT CONTRAST TECHNIQUE: Multidetector CT imaging of the abdomen and pelvis was performed following the standard protocol without IV contrast. COMPARISON:   None. FINDINGS: There is a small right effusion. Mild bibasilar atelectasis. No other acute abnormalities in the lower chest. There is free air throughout the abdomen. The colon is normal in caliber and much smaller than the small bowel. The stomach is normal in appearance. The small bowel loops are diffusely distended. Two potential transition points are identified. First, there is a hernia in the right anterior lateral abdomen as seen on axial image 56. There appears to be an abrupt transition point between the dilated loops of small bowel entering the hernia and a nondilated loop of bowel leaving the hernia. I suspect this is the cause of the small bowel obstruction. There is also an a tubular masslike process in the right lower quadrant, just inferior to the anastomosis best seen on axial images 50 through 61. It is possible that this is a mass. However, I suspect it is more likely to represent abnormal small bowel loops with wall thickening. There is ascites throughout the abdomen. An oval region of low attenuation projected over the right hepatic lobe on axial image 27 is thought to be an abnormal gallbladder containing debris/ sludge with wall thickening and pericholecystic fluid. The remainder of the liver is normal. The spleen is unremarkable. Probable adrenal hyperplasia. Small kidneys consistent with end-stage renal disease. The pancreas is normal. Atherosclerosis is seen in the abdominal aorta. No obvious adenopathy although evaluation is limited. The pelvis demonstrates no adenopathy or mass. The bladder is decompressed. No masses noted. No evidence of bony metastatic disease.  No acute bony abnormalities. IMPRESSION: 1. There is free air in the abdomen consistent with bowel perforation. 2. There is a small bowel obstruction. Evaluation for a transition point is difficult given the lack of intra-abdominal fat and contrast. Two potential sites of transition are identified. The first is the right anterior  lateral hernia. The loop of bowel exiting the hernia appears to be relatively decompressed compared to the more proximal dilated bowel. Additionally, there is a tubular abnormality in the right lower quadrant described above. This is favored to represent abnormal loops of bowel with wall thickening. This is another persist potential site of obstruction. This abnormality is thought to be less likely to represent a mass. 3. The gallbladder is abnormal in appearance with possible sludge, wall thickening, and pericholecystic fluid. An ultrasound could better evaluate. The findings were called to Dr. Rex Kras Electronically Signed   By: Dorise Bullion III M.D   On: 12/20/2015 19:32   Dg Chest Port 1 View  Result Date: 01/05/2016 CLINICAL DATA:  Intubation. EXAM: PORTABLE CHEST 1 VIEW COMPARISON:  01/11/2016 FINDINGS: Endotracheal tube has been placed and terminates at the inferior margin of the clavicular heads, approximately 5 cm above the carina. Enteric tube courses into the left upper abdomen with side hole beyond the GE junction and tip not imaged. Right jugular dialysis catheter remains in place. Cardiomediastinal silhouette is unchanged. Thoracic aortic atherosclerosis. Lung volumes are low with mild elevation of the right hemidiaphragm. There is subsegmental atelectasis in the right lung base. No pleural effusion or pneumothorax is identified. IMPRESSION: 1. Endotracheal tube at the clavicular heads. 2. Low lung volumes with right basilar atelectasis. Electronically Signed   By: Logan Bores M.D.   On: 01/04/2016 01:43  Dg Chest Port 1 View  Result Date: 01/05/2016 CLINICAL DATA:  Acute hypoxia.  Endotracheal tube EXAM: PORTABLE CHEST 1 VIEW COMPARISON:  01/04/2016 FINDINGS: Endotracheal tube remains in good position. Right jugular central venous catheter tip in the proximal SVC unchanged. NG tube enters the stomach. Slightly improved atelectasis right lower lobe. Negative for pneumonia or effusion.  Negative for edema. IMPRESSION: Support lines remain in good position and unchanged Improvement right lower lobe atelectasis. Electronically Signed   By: Franchot Gallo M.D.   On: 01/05/2016 07:34  Dg Chest Port 1 View  Result Date: 12/16/2015 CLINICAL DATA:  Tachypnea, abdominal pain, abnormal EKG today. History of hypertension, diabetes, chronic kidney disease. EXAM: PORTABLE CHEST 1 VIEW COMPARISON:  None. FINDINGS: Heart size is upper normal. Atherosclerotic changes noted at the aortic arch. Study is hypoinspiratory with crowding of the perihilar bronchovascular markings. Elevated right hemidiaphragm obscures visualization of the right lung base. Visualized portions of the lungs are clear. No evidence of pneumonia. No pleural effusion or pneumothorax seen. Osseous structures about the chest are unremarkable. Dialysis catheter appears adequately positioned with tip at the level of the mid SVC. IMPRESSION: 1. Low lung volumes. No acute findings. No evidence of pneumonia. No evidence of CHF. 2. Aortic atherosclerosis. Electronically Signed   By: Franki Cabot M.D.   On: 12/31/2015 16:39    Anti-infectives: Anti-infectives    Start     Dose/Rate Route Frequency Ordered Stop   01/11/2016 2200  piperacillin-tazobactam (ZOSYN) IVPB 2.25 g     2.25 g 100 mL/hr over 30 Minutes Intravenous Every 8 hours 01/12/2016 1654     12/16/2015 1715  vancomycin (VANCOCIN) 500 mg in sodium chloride 0.9 % 100 mL IVPB     500 mg 100 mL/hr over 60  Minutes Intravenous  Once 01/01/2016 1654 12/22/2015 1835   01/12/2016 1615  piperacillin-tazobactam (ZOSYN) IVPB 3.375 g     3.375 g 100 mL/hr over 30 Minutes Intravenous  Once 01/05/2016 1613 12/17/2015 1730   01/08/2016 1615  vancomycin (VANCOCIN) IVPB 1000 mg/200 mL premix     1,000 mg 200 mL/hr over 60 Minutes Intravenous  Once 12/15/2015 1613 01/07/2016 1825      Assessment/Plan: s/p Procedure(s): EXPLORATORY LAPAROTOMY (N/A) DISTAL ILEUM AND COLON RESECTION RIGHT (Right) TERMINAL  ILEOSTOMY WITH HARTMANS POUCH (Right) POD#2 VDRF -per ccm;  CRF - HD per renal FEN - await bowel function; approximately 120 cm of small bowel remaining Acute/ chronic anemia - received one unit PRBC on HD VTE - PAS, no lovenox until Hb stable Code status - discussion of DNR with family by CCM  LOS: 2 days    Graysyn Bache K. 01/05/2016

## 2016-01-05 NOTE — Progress Notes (Signed)
Spoke to pt's son Kyung Rudd.  Confirmed DNR status  -The pt has severe dementia at baseline-->So severe that she is combative and the son has to be with her during HD so she won't hit, kick or spit on staff.  -She had also on several occasions prior to this confirmed DNR  We discussed that both the short and the long term prognosis is severely poor and chance for meaningful recovery slim w/ a much greater chance that she will not leave the hospital .   Plan Full DNR No escalation of care.  Will allow for titration of levophed w/ cap at 15 mcg/min.  No additional meds or gtts Family understands we could be looking at w/d of care OR she could declare herself in next 24-48hrs.   30 minutes time   Erick Colace ACNP-BC [place] Pager # [phone] OR # [phone] if no answer

## 2016-01-05 NOTE — Plan of Care (Signed)
Problem: Health Behavior/Discharge Planning: Goal: Ability to manage health-related needs will improve Outcome: Not Progressing Pt is unable not interactive at this time  Problem: Physical Regulation: Goal: Ability to maintain clinical measurements within normal limits will improve Outcome: Not Progressing Pt coded today, pressor support added  Problem: Skin Integrity: Goal: Risk for impaired skin integrity will decrease Outcome: Progressing Measures take to protect skin  Problem: Fluid Volume: Goal: Ability to maintain a balanced intake and output will improve Outcome: Not Progressing Unable to tolerate full dialysis treatment today  Problem: Nutrition: Goal: Adequate nutrition will be maintained Outcome: Not Progressing Pt does not have nutritional support at this time  Problem: Bowel/Gastric: Goal: Will not experience complications related to bowel motility Outcome: Progressing No bowel sounds at this time, but stomal looks healthy and ileostomy is producing

## 2016-01-05 NOTE — Progress Notes (Signed)
CRITICAL VALUE ALERT  Critical value received: K 6.3  Date of notification:  01/05/16  Time of notification:  [id number]  Critical value read back:Yes.    Nurse who received alert:  Anissa RN, Candyce Churn RN  MD notified (1st page):  Warren Lacy MD called me first  Time of first page:  0500  MD notified (2nd page):  Time of second page:  Responding MD:  n/a  Time MD responded:  n/a

## 2016-01-05 NOTE — Progress Notes (Signed)
I was in the area when this patient of [facility] coded in the ICU.  Unknown why.  Just reading her history, she is very ill, and had significant bowel necrosis with sepsis requiring near total colectomy and hartman's pouch and ileostomy.  CCM is now taking control of the patient after the code.  Nothing to do surgically.  Shelly Cain. Dahlia Bailiff, MD, [place] [phone] [facility]

## 2016-01-05 NOTE — Plan of Care (Signed)
Problem: Respiratory: Goal: Ability to maintain adequate ventilation will improve Outcome: Not Progressing Pt remains with vent support

## 2016-01-05 NOTE — Progress Notes (Signed)
PULMONARY / CRITICAL CARE MEDICINE   Name: Shelly Cain MRN: [id number] DOB: 05/16/1935    ADMISSION DATE:  12/18/2015 CONSULTATION DATE:  12/21/2015  REFERRING MD:  EDP  CHIEF COMPLAINT:  Abdominal pain  HISTORY OF PRESENT ILLNESS:   [name] is a 81F with PMH significant for ESRD on iHD TThSa, HTN, hx colon cancer, diabetes, and dementia who presented to the ED with abd pain on HD  A CT abdomen was obtained that showed free air in the abdomen with a likely SBO with transition point - no clear site of perforation, however. Her exam was notable for diffuse tenderness with guarding, but no rebound. LA was 8.25 on presentation. Underwent  exploratory laparotomy for bowel necrosis and required bowel resection, extensive washout and end ileostomy.   SUBJECTIVE:  S/p cardiac arrest. Now unresponsive and in shock    VITAL SIGNS: BP (!) 111/42   Pulse 92   Temp 99.7 F (37.6 C) (Oral)   Resp 17   Wt 148 lb 13 oz (67.5 kg)   SpO2 100%   BMI 24.02 kg/m   HEMODYNAMICS:    VENTILATOR SETTINGS: Vent Mode: PRVC FiO2 (%):  [40 %-100 %] 40 % Set Rate:  [16 bmp-24 bmp] 24 bmp Vt Set:  [500 mL] 500 mL PEEP:  [5 cmH20] 5 cmH20 Pressure Support:  [5 cmH20] 5 cmH20 Plateau Pressure:  [17 cmH20-37 cmH20] 17 cmH20  INTAKE / OUTPUT: I/O last 3 completed shifts: In: 2896.3 [I.V.:2056.3; NG/GT:90; IV Piggyback:750] Out: 700 [Emesis/NG output:300; Stool:325; Blood:75]  PHYSICAL EXAMINATION:  General Chronically ill appearing, intubated, now unresponsive s/p arrest   HEENT No gross abnormalities. ETT/OGT in place  Pulmonary Diffuse bilaterally  ronchi. Good effort, symmetrical expansion. Occasional ett air leak  Cardiovascular Normal rate, regular rhythm. S1, s2. No m/r/g. Distal pulses palpable.  Abdomen Soft. Bulky dressing in place c/d/i. Stoma pink with scant serosanguinous drainage. Mild tenderness to palpation. +BS.   Musculoskeletal Grossly normal.   Lymphatics No cervical,  supraclavicular or axillary adenopathy.   Neurologic Was agitated. Post-arrest she is GCS 3  Skin/Integuement No rash, no cyanosis, no clubbing. No edema. Right upper chest PermCath.      LABS:  BMET  Recent Labs Lab 01/04/16 1617 01/05/16 0354 01/05/16 0945  NA 136 136 138  K 5.9* 6.3* 4.8  CL 99* 99* 100*  CO2 21* 21* 22  BUN 65* 74* 26*  CREATININE 5.36* 5.77* 2.56*  GLUCOSE 78 151* 67    Electrolytes  Recent Labs Lab 01/04/16 1617 01/05/16 0354 01/05/16 0945  CALCIUM 8.3* 8.3* 12.2*    CBC  Recent Labs Lab 01/04/16 0415 01/05/16 0354 01/05/16 0945  WBC 3.0* 8.4 10.9*  HGB 8.1* 7.3* 7.4*  HCT 25.7* 22.0* 22.9*  PLT 112* 137* 145*    Coag's No results for input(s): APTT, INR in the last 168 hours.  Sepsis Markers  Recent Labs Lab 12/13/2015 1552 01/04/2016 1851  LATICACIDVEN 8.25* 4.43*    ABG  Recent Labs Lab 01/04/16 0211 01/05/16 1010  PHART 7.397 7.460*  PCO2ART 41.8 34.0*  PO2ART 263.0* 535.0*    Liver Enzymes  Recent Labs Lab 12/18/2015 1544  AST 50*  ALT 39  ALKPHOS 39  BILITOT 0.6  ALBUMIN 3.8    Cardiac Enzymes  Recent Labs Lab 01/05/16 0945  TROPONINI 1.00*    Glucose  Recent Labs Lab 01/04/16 1657 01/04/16 1937 01/04/16 2021 01/04/16 2348 01/05/16 0417 01/05/16 0711  GLUCAP 75 69 132* 101* 107* 84  Imaging Dg Chest Port 1 View  Result Date: 01/05/2016 CLINICAL DATA:  Acute hypoxia.  Endotracheal tube EXAM: PORTABLE CHEST 1 VIEW COMPARISON:  01/04/2016 FINDINGS: Endotracheal tube remains in good position. Right jugular central venous catheter tip in the proximal SVC unchanged. NG tube enters the stomach. Slightly improved atelectasis right lower lobe. Negative for pneumonia or effusion. Negative for edema. IMPRESSION: Support lines remain in good position and unchanged Improvement right lower lobe atelectasis. Electronically Signed   By: Franchot Gallo M.D.   On: 01/05/2016 07:34    STUDIES:  CT abd  7/22 >> SBO, free air  CULTURES:   ANTIBIOTICS: Zosyn 7/22 >> Vancomycin 7/22 >>  SIGNIFICANT EVENTS: Distal ileum and colon resection; end ileostomy w/ [place] pouch  LINES/TUBES: PIV  DISCUSSION: [name] is a 81F w/ PMH significant for ESRD on iHD TTSa, DMII, dementia, HTN, hx colon ca, who presented with acute small bowel obstruction and perforation and was taken urgently to the OR for exploratory laparotomy with bowel resection and end ileostomy. Had sudden PEA arrest this am. ROSC 9-10 minutes. Occurred about 2 hours into HD. Suspect that this was metabolically mediated. We treated her for presumptive hyperkalemia and acidosis during the arrest. Her GCS is 3 currently and she remains in shock. Prognosis is poor and she has significant co-morbids including ESRD, dementia and resides in SNF. Will speak w/ family. Reasonable to continue current levels of supportive care, abx, wean pressors. Would NOT code again. Awaiting her son to arrive where we will discuss further care.   ASSESSMENT / PLAN:  GASTROINTESTINAL A:   Acute SBO w/ perforation s/p terminal ileum and colon resection with end ileostomy P:   Vanc/Zosyn NPO Monitor ostomy output Pain control Nutritional advancement per surgical service PULMONARY A: Ventilator dependence.  P:   Full vent support Hold off on weaning until MS supports F/u abg and PCXR following arrest this am  CARDIOVASCULAR A:  Post-procedural hypotension requiring neosynephrine Hx hypertension PEA arrest (time to ROSC 9-10 minutes)-->post-cardiac arrest shock  P:  Cycle CEs--> more for prognostics, she is NOT a cardiac cath candidate ECHO Titrate levophed for MAP >65 Was DNR prior, her son is on the way in. Will make her DNR again in the event she arrests; but will cont to titrate pressors.   RENAL A:   ESRD on iHD TTSa Hyperkalemia-->improved P:   Nephrology following, not CRRT candidate Monitor lytes    HEMATOLOGIC A:    Acute blood loss anemia - Hb drop from 11 to 7.3. Not sure how much of this is blood loss and how much is dilutional  P:  Monitor & transfuse on HD if <7 Hold LMWH Place SCDs F/u cbc am   INFECTIOUS A:   Concern for evolving intraabdominal sepsis s/p bowel perforation-->could be bacterial translocation  P:   Zosyn day 2/x   ENDOCRINE A:   Hypoglycemia post-operatively (iatrogenic) Hx DMII  Hypothyroidism  P:   Follow CBG Maintain euglycemia Continue synthroid  NEUROLOGIC A:   Advanced dementia; Pain P:   RASS goal: 0 Morphine prn   FAMILY  - Updates: No family at bedside.   - Inter-disciplinary family meet or Palliative Care meeting due by:  day 7  61 minutes PCCM time committed to: managing hemodynamics, ventilator support and organization of care w/ nephrology, surgical, nursing and respiratory services.   Erick Colace ACNP-BC [place] Pager # [phone] OR # [phone] if no answer  Attending Note:  81 year old  female with extensive PMH who presents with acute bowel obstruction and perforation.  Patient was taken to the OR and issue addressed but patient remained very critically ill and was unable to extubate, has significant metabolic derangements related to her chronic issue.  This AM, patient suffered a cardiac arrest and was resuscitated.  On exam, she is completely unresponsive and coarse BS diffusely.  Patient is not doing well and now in circulatory shock.  Communicated with family.  Full DNR and no further escalation of care and limit levophed to 15 mcg.  If patient continues to deteriorate then will not increase care any further.  The patient is critically ill with multiple organ systems failure and requires high complexity decision making for assessment and support, frequent evaluation and titration of therapies, application of advanced monitoring technologies and extensive interpretation of multiple databases.   Critical Care  Time devoted to patient care services described in this note is  35  Minutes. This time reflects time of care of this signee Dr Jennet Maduro. This critical care time does not reflect procedure time, or teaching time or supervisory time of PA/NP/Med student/Med Resident etc but could involve care discussion time.  Rush Farmer, M.D. [facility] Pulmonary/Critical Care Medicine. Pager: [phone]. After hours pager: [phone].

## 2016-01-05 NOTE — Progress Notes (Signed)
eLink Physician-Brief Progress Note Patient Name: Aino Lanzillo DOB: 1934-07-30 MRN: [id number]   Date of Service  01/05/2016  HPI/Events of Note  K+ = 6.3. Hx of ESRD on HD. Recent bowel resection, therefore, can't give Kayexalate PO or PR.  eICU Interventions  Will order: 1. D50 1 amp IV now. 2. Novolog 10 units IV now.  3. Will ask bedside nurse to contact Nephrology to expedite HD today.      Intervention Category Major Interventions: Electrolyte abnormality - evaluation and management  Macdonald Rigor [place] 01/05/2016, 5:00 AM

## 2016-01-06 ENCOUNTER — Encounter (HOSPITAL_COMMUNITY): Payer: Self-pay | Admitting: Nurse Practitioner

## 2016-01-06 ENCOUNTER — Inpatient Hospital Stay (HOSPITAL_COMMUNITY): Payer: Medicare (Managed Care)

## 2016-01-06 DIAGNOSIS — G9340 Encephalopathy, unspecified: Secondary | ICD-10-CM

## 2016-01-06 LAB — COMPREHENSIVE METABOLIC PANEL
ALBUMIN: 2.3 g/dL — AB (ref 3.5–5.0)
ALK PHOS: 73 U/L (ref 38–126)
ALT: 343 U/L — ABNORMAL HIGH (ref 14–54)
ANION GAP: 14 (ref 5–15)
AST: 382 U/L — AB (ref 15–41)
BILIRUBIN TOTAL: 0.6 mg/dL (ref 0.3–1.2)
BUN: 55 mg/dL — AB (ref 6–20)
CALCIUM: 8.5 mg/dL — AB (ref 8.9–10.3)
CO2: 23 mmol/L (ref 22–32)
Chloride: 97 mmol/L — ABNORMAL LOW (ref 101–111)
Creatinine, Ser: 4.18 mg/dL — ABNORMAL HIGH (ref 0.44–1.00)
GFR calc Af Amer: 11 mL/min — ABNORMAL LOW (ref >60)
GFR, EST NON AFRICAN AMERICAN: 9 mL/min — AB (ref >60)
GLUCOSE: 150 mg/dL — AB (ref 65–99)
Potassium: 4.9 mmol/L (ref 3.5–5.1)
Sodium: 134 mmol/L — ABNORMAL LOW (ref 135–145)
TOTAL PROTEIN: 5.4 g/dL — AB (ref 6.5–8.1)

## 2016-01-06 LAB — GLUCOSE, CAPILLARY
GLUCOSE-CAPILLARY: 154 mg/dL — AB (ref 65–99)
Glucose-Capillary: <10 mg/dL — CL (ref 65–99)
Glucose-Capillary: 157 mg/dL — ABNORMAL HIGH (ref 65–99)
Glucose-Capillary: 129 mg/dL — ABNORMAL HIGH (ref 65–99)
Glucose-Capillary: 152 mg/dL — ABNORMAL HIGH (ref 65–99)
Glucose-Capillary: 178 mg/dL — ABNORMAL HIGH (ref 65–99)
Glucose-Capillary: 134 mg/dL — ABNORMAL HIGH (ref 65–99)

## 2016-01-06 LAB — PREPARE RBC (CROSSMATCH)

## 2016-01-06 LAB — CBC
HEMATOCRIT: 20.1 % — AB (ref 36.0–46.0)
HEMOGLOBIN: 6.9 g/dL — AB (ref 12.0–15.0)
MCH: 28.5 pg (ref 26.0–34.0)
MCHC: 34.3 g/dL (ref 30.0–36.0)
MCV: 83.1 fL (ref 78.0–100.0)
Platelets: 122 10*3/uL — ABNORMAL LOW (ref 150–400)
RBC: 2.42 MIL/uL — ABNORMAL LOW (ref 3.87–5.11)
RDW: 16.5 % — AB (ref 11.5–15.5)
WBC: 11.7 10*3/uL — ABNORMAL HIGH (ref 4.0–10.5)

## 2016-01-06 LAB — POCT I-STAT 3, ART BLOOD GAS (G3+)
ACID-BASE EXCESS: 4 mmol/L — AB (ref 0.0–2.0)
Bicarbonate: 26.9 mEq/L — ABNORMAL HIGH (ref 20.0–24.0)
O2 SAT: 100 %
PCO2 ART: 32.3 mmHg — AB (ref 35.0–45.0)
PO2 ART: 202 mmHg — AB (ref 80.0–100.0)
Patient temperature: 97.6
TCO2: 28 mmol/L (ref 0–100)
pH, Arterial: 7.526 — ABNORMAL HIGH (ref 7.350–7.450)

## 2016-01-06 LAB — HEPATITIS B SURFACE ANTIGEN: Hepatitis B Surface Ag: NEGATIVE

## 2016-01-06 MED ORDER — SODIUM CHLORIDE 0.9 % IV SOLN
Freq: Once | INTRAVENOUS | Status: AC
  Administered 2016-01-06: 21:00:00 via INTRAVENOUS

## 2016-01-06 MED ORDER — PIPERACILLIN-TAZOBACTAM IN DEX 2-0.25 GM/50ML IV SOLN
2.2500 g | Freq: Three times a day (TID) | INTRAVENOUS | Status: DC
  Administered 2016-01-06 – 2016-01-09 (×9): 2.25 g via INTRAVENOUS
  Filled 2016-01-06 (×12): qty 50

## 2016-01-06 MED ORDER — PIPERACILLIN-TAZOBACTAM IN DEX 2-0.25 GM/50ML IV SOLN
2.2500 g | Freq: Four times a day (QID) | INTRAVENOUS | Status: DC
  Filled 2016-01-06 (×2): qty 50

## 2016-01-06 MED ORDER — SODIUM CHLORIDE 0.9 % IV SOLN
Freq: Once | INTRAVENOUS | Status: AC
  Administered 2016-01-06: 07:00:00 via INTRAVENOUS

## 2016-01-06 NOTE — Progress Notes (Signed)
3 Days Post-Op  Subjective: Events of yesterday noted  Objective: Vital signs in last 24 hours: Temp:  [97.2 F (36.2 C)-99.3 F (37.4 C)] 99.3 F (37.4 C) (07/25 1130) Pulse Rate:  [50-116] 116 (07/25 1137) Resp:  [13-28] 28 (07/25 1137) BP: (127-200)/(36-63) 132/55 (07/25 1137) SpO2:  [83 %-100 %] 100 % (07/25 1137) FiO2 (%):  [30 %-40 %] 30 % (07/25 1137) Last BM Date: 01/06/16  Intake/Output from previous day: 07/24 0701 - 07/25 0700 In: 1974.9 [I.V.:1734.9; NG/GT:90; IV Piggyback:150] Out: 1950 [Emesis/NG output:300; Stool:650] Intake/Output this shift: Total I/O In: 635 [I.V.:300; Blood:335] Out: -   General appearance: no distress GI: soft, wound clean, ileostomy at skin level dark pink, min output  Lab Results:   Recent Labs  01/05/16 0945 01/06/16 0430  WBC 10.9* 11.7*  HGB 7.4* 6.9*  HCT 22.9* 20.1*  PLT 145* 122*   BMET  Recent Labs  01/05/16 0945 01/06/16 0430  NA 138 134*  K 4.8 4.9  CL 100* 97*  CO2 22 23  GLUCOSE 67 150*  BUN 26* 55*  CREATININE 2.56* 4.18*  CALCIUM 12.2* 8.5*   PT/INR No results for input(s): LABPROT, INR in the last 72 hours. ABG  Recent Labs  01/05/16 1010 01/06/16 0446  PHART 7.460* 7.526*  HCO3 24.2* 26.9*    Studies/Results: Dg Chest Port 1 View  Result Date: 01/05/2016 CLINICAL DATA:  Acute hypoxia.  Endotracheal tube EXAM: PORTABLE CHEST 1 VIEW COMPARISON:  01/04/2016 FINDINGS: Endotracheal tube remains in good position. Right jugular central venous catheter tip in the proximal SVC unchanged. NG tube enters the stomach. Slightly improved atelectasis right lower lobe. Negative for pneumonia or effusion. Negative for edema. IMPRESSION: Support lines remain in good position and unchanged Improvement right lower lobe atelectasis. Electronically Signed   By: Franchot Gallo M.D.   On: 01/05/2016 07:34   Anti-infectives: Anti-infectives    Start     Dose/Rate Route Frequency Ordered Stop   01/06/16 1400   piperacillin-tazobactam (ZOSYN) IVPB 2.25 g     2.25 g 100 mL/hr over 30 Minutes Intravenous Every 8 hours 01/06/16 1052     01/06/16 1200  piperacillin-tazobactam (ZOSYN) IVPB 2.25 g  Status:  Discontinued     2.25 g 100 mL/hr over 30 Minutes Intravenous Every 6 hours 01/06/16 1050 01/06/16 1052   12/19/2015 2200  piperacillin-tazobactam (ZOSYN) IVPB 2.25 g  Status:  Discontinued     2.25 g 100 mL/hr over 30 Minutes Intravenous Every 8 hours 01/08/2016 1654 01/06/16 1050   12/19/2015 1715  vancomycin (VANCOCIN) 500 mg in sodium chloride 0.9 % 100 mL IVPB     500 mg 100 mL/hr over 60 Minutes Intravenous  Once 01/10/2016 1654 01/12/2016 1835   12/30/2015 1615  piperacillin-tazobactam (ZOSYN) IVPB 3.375 g     3.375 g 100 mL/hr over 30 Minutes Intravenous  Once 12/24/2015 1613 01/05/2016 1730   12/27/2015 1615  vancomycin (VANCOCIN) IVPB 1000 mg/200 mL premix     1,000 mg 200 mL/hr over 60 Minutes Intravenous  Once 12/23/2015 1613 12/25/2015 1825      Assessment/Plan: s/p Procedure(s): EXPLORATORY LAPAROTOMY (N/A) DISTAL ILEUM AND COLON RESECTION RIGHT (Right) TERMINAL ILEOSTOMY WITH HARTMANS POUCH (Right) POD#3 Await bowel function, no TF yet Place VAC on wound Noted goals of care per family and I spoke with the CCM team at the bedside   LOS: 3 days    Percilla Tweten E 01/06/2016

## 2016-01-06 NOTE — Progress Notes (Signed)
eLink Physician-Brief Progress Note Patient Name: Mykiah Maung DOB: 1935-03-22 MRN: [id number]   Date of Service  01/06/2016  HPI/Events of Note  Anemia - Hgb = 6.9.  eICU Interventions  Will transfuse 1 unit PRBC now.      Intervention Category Intermediate Interventions: Other:  Trudee Chirino Cornelia Copa 01/06/2016, 5:51 AM

## 2016-01-06 NOTE — Progress Notes (Addendum)
ANTIBIOTIC CONSULT NOTE   Pharmacy Consult for Zosyn Indication: sepsis  No Known Allergies  Patient Measurements: Weight: 148 lb 13 oz (67.5 kg) Adjusted Body Weight:   Vital Signs: Temp: 98.8 F (37.1 C) (07/25 0745) Temp Source: Oral (07/25 0745) BP: 131/41 (07/25 0745) Pulse Rate: 25 (07/25 0900) Intake/Output from previous day: 07/24 0701 - 07/25 0700 In: 1974.9 [I.V.:1734.9; NG/GT:90; IV Piggyback:150] Out: 1950 [Emesis/NG output:300; Stool:650] Intake/Output from this shift: Total I/O In: 485 [I.V.:150; Blood:335] Out: -   Labs:  Recent Labs  01/05/16 0354 01/05/16 0945 01/06/16 0430  WBC 8.4 10.9* 11.7*  HGB 7.3* 7.4* 6.9*  PLT 137* 145* 122*  CREATININE 5.77* 2.56* 4.18*   Estimated Creatinine Clearance: 10.2 mL/min (by C-G formula based on SCr of 4.18 mg/dL). No results for input(s): VANCOTROUGH, VANCOPEAK, VANCORANDOM, GENTTROUGH, GENTPEAK, GENTRANDOM, TOBRATROUGH, TOBRAPEAK, TOBRARND, AMIKACINPEAK, AMIKACINTROU, AMIKACIN in the last 72 hours.   Microbiology: Recent Results (from the past 720 hour(s))  Culture, blood (routine x 2)     Status: None (Preliminary result)   Collection Time: 01/01/2016  4:25 PM  Result Value Ref Range Status   Specimen Description LEFT ANTECUBITAL  Final   Special Requests IN PEDIATRIC BOTTLE 2CC  Final   Culture NO GROWTH 2 DAYS  Final   Report Status PENDING  Incomplete  Urine culture     Status: None   Collection Time: 12/15/2015  4:55 PM  Result Value Ref Range Status   Specimen Description URINE, CATHETERIZED  Final   Special Requests Normal  Final   Culture NO GROWTH  Final   Report Status 01/04/2016 FINAL  Final  Culture, blood (routine x 2)     Status: None (Preliminary result)   Collection Time: 12/18/2015  6:35 PM  Result Value Ref Range Status   Specimen Description BLOOD LEFT HAND  Final   Special Requests BOTTLES DRAWN AEROBIC AND ANAEROBIC 5CC  Final   Culture NO GROWTH 3 DAYS  Final   Report Status PENDING   Incomplete  MRSA PCR Screening     Status: None   Collection Time: 01/04/16  3:00 AM  Result Value Ref Range Status   MRSA by PCR NEGATIVE NEGATIVE Final    Comment:        The GeneXpert MRSA Assay (FDA approved for NASAL specimens only), is one component of a comprehensive MRSA colonization surveillance program. It is not intended to diagnose MRSA infection nor to guide or monitor treatment for MRSA infections.     Medical History: Past Medical History:  Diagnosis Date  . Cancer [facility])    Colon  . Chronic kidney disease   . Dementia   . Diabetes mellitus without complication ([place])   . Hypertension   . Thyroid disease     Assessment: CC: 85 yof SNF patient c/o abdominal pain s/p dialysis 2 days ago. She had loose stools significant for abdominal pain the following day. Last BM was this morning and she reported rectal pain during BM. She reports worsened abdominal pain and was sent here after only 1 hour of dialysis. She reports some vomiting two days ago and ongoing nausea.   PMH: HTN, DM, CKD on HD on TThSa, Thyroid disease  - Significant bowel necrosis with sepsis. Had ex lap overnight and required near total bowel resection with extensive washout and end ileostomy.   ID: Infectious Disease: Abx empirically for Sepsis.  Afebrile, WBC low at 11.7 up, LA 8.25>4.43  Vanc 7/22>>7/23 *loaded with 1500mg  total- d/c'd by  Dr. Elsworth Soho Zosyn 7/22>>  7/22 BCx: NGTD 7/22 UCx: negative 7/23 MRSA PCR: neg   Goal of Therapy:  Eradication of infection  Plan:  - Zosyn 2.25g IV q8h - f/u next HD  - Does patient need to be on Vancomycin also?  Lanyah Spengler S. Alford Highland, PharmD, BCPS Clinical Staff Pharmacist Pager [phone]  Eilene Ghazi Stillinger 01/06/2016,10:48 AM

## 2016-01-06 NOTE — Consult Note (Signed)
[place] Nurse wound consult note Reason for Consult: NPWT VAC placement to the midline surgical wound New ileostomy Wound type: surgical  Measurement: 19cm x 4cm x 3.5cm  Wound bed: subcutaneous tissue, extremely dry Drainage (amount, consistency, odor) none, slight odor from wound Periwound:intact Skin on the abdomen is really lax, lots of excess skin.  Dressing procedure/placement/frequency:  With amount of sagging skin on the abdomen and the location of the ostomy to the midline I decided to steristrip the skin in order to pull the midline incision a bit from the ostomy and create a flatter surface for pouching.   Periwound skin prepped with benzoin prior to using steri strips. 1pc of black foam used to fill the wound bed.  Drape and sealed at 135mmHG. Pt. Tolerated well. On the vent currently.  [place] Nurse ostomy consult note Stoma type/location: RLQ, end ileostomy Stomal assessment/size: 1 1/4" round, budded but has very flaccid abdomen Peristomal assessment: intact, deep dip in the skin at 9 o'clock due to flaccid skin  Treatment options for stomal/peristomal skin: using 2" barrier ring to fill dip at 9 o'clock  Output liquid brown output Ostomy pouching: 1pc. Education provided: NA at this time, patient on the vent. No family in the patient's room Enrolled patient in [place] program: No  WOC nurse team will follow along with you for support with wound and ostomy care. Tanise Russman [place] RN,CWOCN [id number]

## 2016-01-06 NOTE — Progress Notes (Signed)
CRITICAL VALUE ALERT  Critical value received:  Hgb 6.9  Date of notification:  01/06/16  Time of notification:  0507  Critical value read back:Yes.    Nurse who received alert:  Reinaldo Berber RN  MD notified (1st page):  Paged CCS doctor with no call back at 0520, [place] doctor put in order for blood  Time of first page:  n/a  MD notified (2nd page):  Time of second page:  Responding MD:  n/a  Time MD responded:  n/a

## 2016-01-06 NOTE — Progress Notes (Signed)
PULMONARY / CRITICAL CARE MEDICINE   Name: Shelly Cain MRN: [id number] DOB: [birth year]    ADMISSION DATE:  12/29/2015 CONSULTATION DATE:  12/17/2015  REFERRING MD:  EDP  CHIEF COMPLAINT:  Abdominal pain  HISTORY OF PRESENT ILLNESS:   [name] is a 35F with PMH significant for ESRD on iHD TThSa, HTN, hx colon cancer, diabetes, and dementia who presented to the ED with abd pain on HD  A CT abdomen was obtained that showed free air in the abdomen with a likely SBO with transition point - no clear site of perforation, however. Her exam was notable for diffuse tenderness with guarding, but no rebound. LA was 8.25 on presentation. Underwent  exploratory laparotomy for bowel necrosis and required bowel resection, extensive washout and end ileostomy.   SUBJECTIVE:  Spontaneous movement noted, still requiring pressors for BP remains on ventilator   VITAL SIGNS: BP (!) 131/41   Pulse (!) 25   Temp 98.8 F (37.1 C) (Oral)   Resp (!) 24   Wt 148 lb 13 oz (67.5 kg)   SpO2 93%   BMI 24.02 kg/m   HEMODYNAMICS:    VENTILATOR SETTINGS: Vent Mode: PRVC FiO2 (%):  [30 %-40 %] 30 % Set Rate:  [24 bmp] 24 bmp Vt Set:  [500 mL] 500 mL PEEP:  [5 cmH20] 5 cmH20 Plateau Pressure:  [16 cmH20-17 cmH20] 17 cmH20  INTAKE / OUTPUT: I/O last 3 completed shifts: In: 2404.9 [I.V.:2064.9; NG/GT:90; IV Piggyback:250] Out: 2375 [Emesis/NG output:400; Other:1000; Stool:975]  PHYSICAL EXAMINATION:  General Chronically ill appearing, intubated, having spontaneous movement of LUE  HEENT No gross abnormalities. ETT/OGT in place  Pulmonary Diffuse bilaterally  ronchi. Good effort, symmetrical expansion. Occasional ett air leak  Cardiovascular Normal rate, regular rhythm. S1, s2. No m/r/g. Distal pulses palpable.  Abdomen Soft. Bulky dressing in place c/d/i. Stoma pink with scant serosanguinous drainage. Mild tenderness to palpation. +BS.   Musculoskeletal Grossly normal.   Lymphatics No cervical,  supraclavicular or axillary adenopathy.   Neurologic No eye opening, spontaneous movement of LUE, does not follow commands  Skin/Integuement No rash, no cyanosis, no clubbing. No edema. Right upper chest PermCath.      LABS:  BMET  Recent Labs Lab 01/05/16 0354 01/05/16 0945 01/06/16 0430  NA 136 138 134*  K 6.3* 4.8 4.9  CL 99* 100* 97*  CO2 21* 22 23  BUN 74* 26* 55*  CREATININE 5.77* 2.56* 4.18*  GLUCOSE 151* 67 150*    Electrolytes  Recent Labs Lab 01/05/16 0354 01/05/16 0945 01/06/16 0430  CALCIUM 8.3* 12.2* 8.5*    CBC  Recent Labs Lab 01/05/16 0354 01/05/16 0945 01/06/16 0430  WBC 8.4 10.9* 11.7*  HGB 7.3* 7.4* 6.9*  HCT 22.0* 22.9* 20.1*  PLT 137* 145* 122*    Coag's No results for input(s): APTT, INR in the last 168 hours.  Sepsis Markers  Recent Labs Lab 12/20/2015 1552 01/05/2016 1851  LATICACIDVEN 8.25* 4.43*    ABG  Recent Labs Lab 01/04/16 0211 01/05/16 1010 01/06/16 0446  PHART 7.397 7.460* 7.526*  PCO2ART 41.8 34.0* 32.3*  PO2ART 263.0* 535.0* 202.0*    Liver Enzymes  Recent Labs Lab 01/04/2016 1544 01/06/16 0430  AST 50* 382*  ALT 39 343*  ALKPHOS 39 73  BILITOT 0.6 0.6  ALBUMIN 3.8 2.3*    Cardiac Enzymes  Recent Labs Lab 01/05/16 0945  TROPONINI 1.00*    Glucose  Recent Labs Lab 01/05/16 1235 01/05/16 1526 01/05/16 2040 01/05/16 2356 01/06/16 0434  01/06/16 0727  GLUCAP 112* 77 140* 154* 157* 129*    Imaging No results found.   STUDIES:  CT abd 7/22 >> SBO, free air  CULTURES:   ANTIBIOTICS: Zosyn 7/22 >> Vancomycin 7/22 >>  SIGNIFICANT EVENTS: Distal ileum and colon resection; end ileostomy w/ [place] pouch  LINES/TUBES: PIV VasCath  DISCUSSION: [name] is a 53F w/ PMH significant for ESRD on iHD TTSa, DMII, dementia, HTN, hx colon ca, who presented with acute small bowel obstruction and perforation and was taken urgently to the OR for exploratory laparotomy with bowel  resection and end ileostomy. Had sudden PEA arrest this am. ROSC 9-10 minutes. Occurred about 2 hours into HD. Suspect that this was metabolically mediated. We treated her for presumptive hyperkalemia and acidosis during the arrest. Her GCS is 3 currently and she remains in shock. Prognosis is poor and she has significant co-morbids including ESRD, dementia and resides in SNF.  Reasonable to continue current levels of supportive care, abx, wean pressors. Will not escalate level of pressors. Would NOT code again. Continue to wean pressors and ventilator as able. Consider MRI for evidence of anoxic brain injury.   ASSESSMENT / PLAN:  GASTROINTESTINAL A:   Acute SBO w/ perforation s/p terminal ileum and colon resection with end ileostomy P:   Vanc/Zosyn NPO Monitor ostomy output Pain control Nutritional advancement per surgical service  PULMONARY A: Ventilator dependence. P:   Full vent support Begin weaning as tolerated Metabolic alkalosis on ABG CXR stable Cholrhexidine for VAP prevention ABG/CXR in the AM  CARDIOVASCULAR A:  Post-procedural hypotension requiring neosynephrine Hx hypertension PEA arrest (time to ROSC 9-10 minutes)-->post-cardiac arrest shock  P:  Cycle CEs--> more for prognostics, she is NOT a cardiac cath candidate ECHO Titrate levophed for MAP >65 (will not increase Levo over 15 mcgs) DNR in the setting of arrest, will continue pressors as needed   RENAL A:   ESRD on iHD TTSa Hyperkalemia-->improved P:   Nephrology following, not CRRT candidate Monitor lytes  HEMATOLOGIC A:   Acute blood loss anemia - Hb drop from 11 to 7.3. Not sure how much of this is blood loss and how much is dilutional  P:  Monitor & transfuse on HD if <7 Transfuse one unit PRBC today Hold LMWH ubtil HgB stabilizes Place SCDs F/u cbc after transfusion  INFECTIOUS A:   Concern for evolving intraabdominal sepsis s/p bowel perforation-->could be bacterial translocation   Leukocytosis P:   Zosyn day 2/x   ENDOCRINE A:   Hypoglycemia post-operatively (iatrogenic) Hx DMII  Hypothyroidism  P:   Follow CBG Maintain euglycemia Continue synthroid  NEUROLOGIC A:   Advanced dementia; Pain P:   RASS goal: 0 Morphine prn  FAMILY  - Updates: No family at bedside.   - Inter-disciplinary family meet or Palliative Care meeting due by:  day 7  Attending Note:  81 year old dialysis patient who suffered a cardiac arrest 7/24.  She is not pressor dependent.  Family does not wish for any escalation of care but no withdrawal for now.  On exam, unresponsive with coarse BS diffusely.  Will continue full vent support.  Levo for BP support.  Limit levo to 15.  Continue abx and f/u on cultures.  Depending on her rate of progress will determine need for palliation.  The patient is critically ill with multiple organ systems failure and requires high complexity decision making for assessment and support, frequent evaluation and titration of therapies, application of advanced monitoring technologies and  extensive interpretation of multiple databases.   Critical Care Time devoted to patient care services described in this note is  35  Minutes. This time reflects time of care of this signee Dr Jennet Maduro. This critical care time does not reflect procedure time, or teaching time or supervisory time of PA/NP/Med student/Med Resident etc but could involve care discussion time.  Rush Farmer, M.D. [facility] Pulmonary/Critical Care Medicine. Pager: [phone]. After hours pager: [phone].

## 2016-01-06 NOTE — Care Management Important Message (Signed)
Important Message  Patient Details  Name: Shelly Cain MRN: [id number] Date of Birth: [birth year]   Medicare Important Message Given:  Yes    Nathen May 01/06/2016, 10:32 AM

## 2016-01-06 NOTE — Progress Notes (Deleted)
  [facility] Progress Note   Subjective: BP's labile overnight on pressors.  DNR now.    Vitals:   01/06/16 0900 01/06/16 1000 01/06/16 1130 01/06/16 1137  BP:    (!) 132/55  Pulse: 95 (!) 109  (!) 116  Resp: (!) 24 (!) 24  (!) 28  Temp:   99.3 F (37.4 C)   TempSrc:   Axillary   SpO2: 93%   100%  Weight:        Inpatient medications: . sodium chloride   Intravenous Once  . antiseptic oral rinse  7 mL Mouth Rinse QID  . chlorhexidine gluconate (SAGE KIT)  15 mL Mouth Rinse BID  . famotidine (PEPCID) IV  20 mg Intravenous Q24H  . insulin aspart  0-9 Units Subcutaneous Q4H  . piperacillin-tazobactam (ZOSYN)  IV  2.25 g Intravenous Q8H   . dextrose 30 mL/hr at 01/06/16 1000  . norepinephrine (LEVOPHED) Adult infusion 12 mcg/min (01/06/16 1000)   sodium chloride, sodium chloride, alteplase, fentaNYL (SUBLIMAZE) injection, heparin, lidocaine (PF), lidocaine-prilocaine, morphine injection, pentafluoroprop-tetrafluoroeth  Exam: Frail elderly AAF, on vent No jvd Chest clear bilat RRR no mrg Abd soft ntnd ostomy bag RLQ Ext no edema R IJ cath Neuro is sedated, moving head around a bit, sedated and not responding to voice  Dialysis: TTS [facility]   62kg   2/2 bath  Heparin yes      Assessment: 1  Ischemic bowel sp resection - per surg/ CCM 2  Cardiac arrest - 7/24, resuscitated 3  ESRD - hold HD today, reassess in am 4  Hyperkalemia - mild, resolved 5  Chron mental illness- schizoaffective disorder w chron dementia 6  DM2 7  Shock - on pressors 8  DNR 9  EOL - very poor QOL at home and at outpt HD (see CCM notes re: conversation w son)  Plan - not a CRRT candidate, plan HD tomorrow as tolerated   Kelly Splinter MD [facility] pager [phone]    cell [phone] 01/06/2016, 11:41 AM    Recent Labs Lab 01/05/16 0354 01/05/16 0945 01/06/16 0430  NA 136 138 134*  K 6.3* 4.8 4.9  CL 99* 100* 97*  CO2 21* 22 23  GLUCOSE 151* 67 150*  BUN  74* 26* 55*  CREATININE 5.77* 2.56* 4.18*  CALCIUM 8.3* 12.2* 8.5*    Recent Labs Lab 12/24/2015 1544 01/06/16 0430  AST 50* 382*  ALT 39 343*  ALKPHOS 39 73  BILITOT 0.6 0.6  PROT 6.7 5.4*  ALBUMIN 3.8 2.3*    Recent Labs Lab 01/05/2016 1544  01/05/16 0354 01/05/16 0945 01/06/16 0430  WBC 7.3  < > 8.4 10.9* 11.7*  NEUTROABS 6.6  --   --   --   --   HGB 9.7*  < > 7.3* 7.4* 6.9*  HCT 30.5*  < > 22.0* 22.9* 20.1*  MCV 88.4  < > 85.9 87.1 83.1  PLT 147*  < > 137* 145* 122*  < > = values in this interval not displayed. Iron/TIBC/Ferritin/ %Sat No results found for: IRON, TIBC, FERRITIN, IRONPCTSAT

## 2016-01-07 ENCOUNTER — Inpatient Hospital Stay (HOSPITAL_COMMUNITY): Payer: Medicare (Managed Care)

## 2016-01-07 DIAGNOSIS — R570 Cardiogenic shock: Secondary | ICD-10-CM

## 2016-01-07 DIAGNOSIS — J9383 Other pneumothorax: Secondary | ICD-10-CM

## 2016-01-07 DIAGNOSIS — Z789 Other specified health status: Secondary | ICD-10-CM

## 2016-01-07 LAB — POCT I-STAT 3, ART BLOOD GAS (G3+)
ACID-BASE EXCESS: 1 mmol/L (ref 0.0–2.0)
Bicarbonate: 23.3 mEq/L (ref 20.0–24.0)
O2 SAT: 99 %
TCO2: 24 mmol/L (ref 0–100)
pCO2 arterial: 29.4 mmHg — ABNORMAL LOW (ref 35.0–45.0)
pH, Arterial: 7.508 — ABNORMAL HIGH (ref 7.350–7.450)
pO2, Arterial: 141 mmHg — ABNORMAL HIGH (ref 80.0–100.0)

## 2016-01-07 LAB — BASIC METABOLIC PANEL
ANION GAP: 17 — AB (ref 5–15)
BUN: 68 mg/dL — ABNORMAL HIGH (ref 6–20)
CO2: 20 mmol/L — ABNORMAL LOW (ref 22–32)
Calcium: 8.3 mg/dL — ABNORMAL LOW (ref 8.9–10.3)
Chloride: 92 mmol/L — ABNORMAL LOW (ref 101–111)
Creatinine, Ser: 4.51 mg/dL — ABNORMAL HIGH (ref 0.44–1.00)
GFR calc Af Amer: 10 mL/min — ABNORMAL LOW (ref >60)
GFR, EST NON AFRICAN AMERICAN: 8 mL/min — AB (ref >60)
Glucose, Bld: 130 mg/dL — ABNORMAL HIGH (ref 65–99)
POTASSIUM: 4.8 mmol/L (ref 3.5–5.1)
SODIUM: 129 mmol/L — AB (ref 135–145)

## 2016-01-07 LAB — GLUCOSE, CAPILLARY
GLUCOSE-CAPILLARY: 110 mg/dL — AB (ref 65–99)
GLUCOSE-CAPILLARY: 145 mg/dL — AB (ref 65–99)
GLUCOSE-CAPILLARY: 111 mg/dL — AB (ref 65–99)
GLUCOSE-CAPILLARY: 149 mg/dL — AB (ref 65–99)
Glucose-Capillary: 144 mg/dL — ABNORMAL HIGH (ref 65–99)
Glucose-Capillary: 150 mg/dL — ABNORMAL HIGH (ref 65–99)
Glucose-Capillary: 157 mg/dL — ABNORMAL HIGH (ref 65–99)

## 2016-01-07 LAB — CBC
HCT: 23.3 % — ABNORMAL LOW (ref 36.0–46.0)
Hemoglobin: 7.9 g/dL — ABNORMAL LOW (ref 12.0–15.0)
MCH: 27.9 pg (ref 26.0–34.0)
MCHC: 33.9 g/dL (ref 30.0–36.0)
MCV: 82.3 fL (ref 78.0–100.0)
PLATELETS: 108 10*3/uL — AB (ref 150–400)
RBC: 2.83 MIL/uL — AB (ref 3.87–5.11)
RDW: 15.9 % — ABNORMAL HIGH (ref 11.5–15.5)
WBC: 16.1 10*3/uL — AB (ref 4.0–10.5)

## 2016-01-07 LAB — MAGNESIUM: MAGNESIUM: 2 mg/dL (ref 1.7–2.4)

## 2016-01-07 LAB — PHOSPHORUS: Phosphorus: 5.9 mg/dL — ABNORMAL HIGH (ref 2.5–4.6)

## 2016-01-07 MED ORDER — NEPRO/CARBSTEADY PO LIQD
1000.0000 mL | ORAL | Status: DC
  Administered 2016-01-07: 1000 mL
  Filled 2016-01-07 (×3): qty 1000

## 2016-01-07 MED ORDER — PENTAFLUOROPROP-TETRAFLUOROETH EX AERO: 1.0000 "application " | INHALATION_SPRAY | CUTANEOUS | Status: DC | PRN

## 2016-01-07 MED ORDER — HEPARIN SODIUM (PORCINE) 1000 UNIT/ML DIALYSIS: 1000.0000 [IU] | INTRAMUSCULAR | Status: DC | PRN

## 2016-01-07 MED ORDER — HEPARIN SODIUM (PORCINE) 1000 UNIT/ML DIALYSIS: 2000.0000 [IU] | INTRAMUSCULAR | Status: DC | PRN

## 2016-01-07 MED ORDER — PRO-STAT SUGAR FREE PO LIQD
60.0000 mL | Freq: Three times a day (TID) | ORAL | Status: DC
  Administered 2016-01-07 – 2016-01-08 (×5): 60 mL
  Filled 2016-01-07 (×7): qty 60

## 2016-01-07 MED ORDER — SODIUM CHLORIDE 0.9 % IV SOLN: 100.0000 mL | INTRAVENOUS | Status: DC | PRN

## 2016-01-07 MED ORDER — LIDOCAINE-PRILOCAINE 2.5-2.5 % EX CREA
1.0000 "application " | TOPICAL_CREAM | CUTANEOUS | Status: DC | PRN
  Filled 2016-01-07: qty 5

## 2016-01-07 MED ORDER — ALTEPLASE 2 MG IJ SOLR: 2.0000 mg | Freq: Once | INTRAMUSCULAR | Status: DC | PRN

## 2016-01-07 MED ORDER — LIDOCAINE HCL (PF) 1 % IJ SOLN: 5.0000 mL | INTRAMUSCULAR | Status: DC | PRN

## 2016-01-07 MED ORDER — ADULT MULTIVITAMIN LIQUID CH
15.0000 mL | Freq: Every day | ORAL | Status: DC
  Administered 2016-01-07 – 2016-01-08 (×2): 15 mL
  Filled 2016-01-07 (×2): qty 15

## 2016-01-07 NOTE — Progress Notes (Signed)
Per CCM NP student, L IJ is not sitting in the correct location and they would need to reposition.  IJ flushes and draws blood back easily.

## 2016-01-07 NOTE — Progress Notes (Signed)
Spent 30 minutes plus with patient son Shelly Cain and his wife providing support and listening to their concerns.  Shelly Cain and his wife informed me of the many health challenges Shelly Cain has gone through and the challenges their family has worked through over the past several months.    They informed me with her waking and following commands today has made decisions to remove life support more difficult on them.    Her son has informed me they would like to maintain the DNR status. If her heart were to stop they would accept that, but they are not at a point where they are comfortable removing any life support at this time.  Other sons and family are planning to arrive on Friday 01/07/2016 and depending on progress at that time they are open to revisiting continuation of life support measures.    Richardean Canal RN, BSN, CCRN

## 2016-01-07 NOTE — Progress Notes (Signed)
  [facility] Progress Note   Subjective: BP's labile overnight on pressors.  DNR now.    Vitals:   01/07/16 0723 01/07/16 0800 01/07/16 1055 01/07/16 1125  BP:  (!) 112/47    Pulse:      Resp:      Temp: 98.5 F (36.9 C)   98 F (36.7 C)  TempSrc: Axillary   Axillary  SpO2:      Weight:      Height:   _0  (1.676 m)     Inpatient medications: . antiseptic oral rinse  7 mL Mouth Rinse QID  . chlorhexidine gluconate (SAGE KIT)  15 mL Mouth Rinse BID  . famotidine (PEPCID) IV  20 mg Intravenous Q24H  . insulin aspart  0-9 Units Subcutaneous Q4H  . piperacillin-tazobactam (ZOSYN)  IV  2.25 g Intravenous Q8H   . dextrose 30 mL/hr at 01/06/16 2105  . norepinephrine (LEVOPHED) Adult infusion 6 mcg/min (01/07/16 1100)   sodium chloride, sodium chloride, fentaNYL (SUBLIMAZE) injection, morphine injection  Exam: Frail elderly AAF, on vent No jvd Chest clear bilat RRR no mrg Abd soft ntnd ostomy bag RLQ Ext no edema R IJ cath Neuro is sedated, moving head around a bit, sedated and not responding to voice  Dialysis: TTS [facility]   62kg   2/2 bath  Heparin yes      Assessment: 1  Ischemic bowel sp resection - per surg/ CCM 2  Cardiac arrest - 7/24, resuscitated 3  ESRD - hold HD today, reassess in am 4  Hyperkalemia - mild, resolved 5  Chron mental illness- schizoaffective disorder w chron dementia 6  DM2 7  Shock - on pressors 8  DNR 9  EOL - very poor QOL at home and at outpt HD (see CCM notes re: conversation w son)  Plan - not a CRRT candidate, plan HD tomorrow as tolerated   Kelly Splinter MD [facility] pager [phone]    cell [phone] 01/06/2016, 11:37 AM    Recent Labs Lab 01/05/16 0945 01/06/16 0430 01/07/16 0442  NA 138 134* 129*  K 4.8 4.9 4.8  CL 100* 97* 92*  CO2 22 23 20*  GLUCOSE 67 150* 130*  BUN 26* 55* 68*  CREATININE 2.56* 4.18* 4.51*  CALCIUM 12.2* 8.5* 8.3*  PHOS  --   --  5.9*    Recent Labs Lab  12/20/2015 1544 01/06/16 0430  AST 50* 382*  ALT 39 343*  ALKPHOS 39 73  BILITOT 0.6 0.6  PROT 6.7 5.4*  ALBUMIN 3.8 2.3*    Recent Labs Lab 12/22/2015 1544  01/05/16 0945 01/06/16 0430 01/07/16 0442  WBC 7.3  < > 10.9* 11.7* 16.1*  NEUTROABS 6.6  --   --   --   --   HGB 9.7*  < > 7.4* 6.9* 7.9*  HCT 30.5*  < > 22.9* 20.1* 23.3*  MCV 88.4  < > 87.1 83.1 82.3  PLT 147*  < > 145* 122* 108*  < > = values in this interval not displayed. Iron/TIBC/Ferritin/ %Sat No results found for: IRON, TIBC, FERRITIN, IRONPCTSAT

## 2016-01-07 NOTE — Procedures (Signed)
Central Venous Catheter Insertion Procedure Note Danniel Vandevoorde [id number] 1934/07/01  Procedure: Insertion of Central Venous Catheter Indications: Drug and/or fluid administration  Procedure Details Consent: Risks of procedure as well as the alternatives and risks of each were explained to the (patient/caregiver).  Consent for procedure obtained. Time Out: Verified patient identification, verified procedure, site/side was marked, verified correct patient position, special equipment/implants available, medications/allergies/relevent history reviewed, required imaging and test results available.  Performed Real time [place] was used to ID and cannulate vessel  Maximum sterile technique was used including antiseptics, cap, gloves, gown, hand hygiene, mask and sheet. Skin prep: Chlorhexidine; local anesthetic administered A antimicrobial bonded/coated triple lumen catheter was placed in the left internal jugular vein using the Seldinger technique.  Evaluation Blood flow good Complications: No apparent complications Patient did tolerate procedure well. Chest X-ray ordered to verify placement.  CXR: pending.  [name] 01/07/2016, 10:01 AM  Erick Colace ACNP-BC [place] Pager # [phone] OR # [phone] if no answer  Rush Farmer, M.D. [facility] Pulmonary/Critical Care Medicine. Pager: [phone]. After hours pager: [phone].

## 2016-01-07 NOTE — Progress Notes (Addendum)
  [facility] Progress Note   Subjective: BP's better, off pressors  Vitals:   01/07/16 0723 01/07/16 0800 01/07/16 1055 01/07/16 1125  BP:  (!) 112/47    Pulse:      Resp:      Temp: 98.5 F (36.9 C)   98 F (36.7 C)  TempSrc: Axillary   Axillary  SpO2:      Weight:      Height:   '5\' 6"'$  (1.676 m)     Inpatient medications: . antiseptic oral rinse  7 mL Mouth Rinse QID  . chlorhexidine gluconate (SAGE KIT)  15 mL Mouth Rinse BID  . famotidine (PEPCID) IV  20 mg Intravenous Q24H  . insulin aspart  0-9 Units Subcutaneous Q4H  . piperacillin-tazobactam (ZOSYN)  IV  2.25 g Intravenous Q8H   . dextrose 30 mL/hr at 01/06/16 2105  . norepinephrine (LEVOPHED) Adult infusion 6 mcg/min (01/07/16 1100)   sodium chloride, sodium chloride, fentaNYL (SUBLIMAZE) injection, morphine injection  Exam: Frail elderly AAF, on vent No jvd Chest clear bilat RRR no mrg Abd soft ntnd ostomy bag RLQ Ext 1+ edema R IJ cath Neuro is sedated, moving head around a bit, sedated and not responding to voice  Dialysis: TTS [facility]   62kg   2/2 bath  Heparin yes      Assessment: 1  Ischemic bowel sp resection - per surg/ CCM 2  Cardiac arrest - 7/24, resuscitated 3  ESRD - usual HD TTS, not CRRT candidate 4  Chron mental illness- schizoaffective disorder w chron dementia 5  DM2 6  Shock - resolved 7  Volume - at dry wt, but prob has some extra vol 8  EOL - is DNR, continue current care per family for now, no escalation   Plan - HD today in ICU, UF 2-3L as Ronnie Derby MD [facility] pager [phone]    cell [phone] 01/07/2016, 11:46 AM    Recent Labs Lab 01/05/16 0945 01/06/16 0430 01/07/16 0442  NA 138 134* 129*  K 4.8 4.9 4.8  CL 100* 97* 92*  CO2 22 23 20*  GLUCOSE 67 150* 130*  BUN 26* 55* 68*  CREATININE 2.56* 4.18* 4.51*  CALCIUM 12.2* 8.5* 8.3*  PHOS  --   --  5.9*    Recent Labs Lab 01/11/2016 1544 01/06/16 0430  AST 50* 382*   ALT 39 343*  ALKPHOS 39 73  BILITOT 0.6 0.6  PROT 6.7 5.4*  ALBUMIN 3.8 2.3*    Recent Labs Lab 01/10/2016 1544  01/05/16 0945 01/06/16 0430 01/07/16 0442  WBC 7.3  < > 10.9* 11.7* 16.1*  NEUTROABS 6.6  --   --   --   --   HGB 9.7*  < > 7.4* 6.9* 7.9*  HCT 30.5*  < > 22.9* 20.1* 23.3*  MCV 88.4  < > 87.1 83.1 82.3  PLT 147*  < > 145* 122* 108*  < > = values in this interval not displayed. Iron/TIBC/Ferritin/ %Sat No results found for: IRON, TIBC, FERRITIN, IRONPCTSAT

## 2016-01-07 NOTE — Progress Notes (Addendum)
Initial Nutrition Assessment  DOCUMENTATION CODES:   Not applicable  INTERVENTION:    Initiate Nepro formula at goal rate of 20 ml/hr with Prostat liquid protein 60 ml TID to provide 1464 kcals, 129 gm protein, 349 ml of free water   Liquid MVI daily   NUTRITION DIAGNOSIS:   Inadequate oral intake related to inability to eat as evidenced by NPO status  GOAL:   Patient will meet greater than or equal to 90% of their needs  MONITOR:   TF tolerance, Vent status, Labs, Weight trends, I & O's  REASON FOR ASSESSMENT:   Consult Enteral/tube feeding initiation and management  ASSESSMENT:   40F with PMH significant for ESRD on HD TThSa, HTN, hx colon cancer, diabetes, and dementia who presented to the ED with abd pain on HD.  Patient s/p procedures 7/23: EXPLORATORY LAPAROTOMY (N/A) DISTAL ILEUM AND COLON RESECTION RIGHT (Right) TERMINAL ILEOSTOMY WITH HARTMAN POUCH (Right)  Patient is currently intubated on ventilator support >> NGT in place MV: 11.9 L/min Temp (24hrs), Avg:98.6 F (37 C), Min:98 F (36.7 C), Max:99 F (37.2 C)  CCM note reviewed. Pt s/p PEA arrest 7/24. Nephrology following >> pt not candidate for CRRT.  CWOCN following for NPWT and ostomy care.  Diet Order:  Diet NPO time specified  Skin:  Wound (see comment) (NPWT VAC to midline surgical wound)  Last BM:  7/25  Height:   Ht Readings from Last 1 Encounters:  01/07/16 5\' 6"  (1.676 m)    Weight:   Wt Readings from Last 1 Encounters:  01/05/16 148 lb 13 oz (67.5 kg)    Ideal Body Weight:  59 kg  BMI:  Body mass index is 24.02 kg/m.  Estimated Nutritional Needs:   Kcal:  1524  Protein:  120-130 gm  Fluid:  per MD  EDUCATION NEEDS:   No education needs identified at this time  Arthur Holms, RD, LDN Pager #: [phone] After-Hours Pager #: [phone]

## 2016-01-07 NOTE — Progress Notes (Signed)
Pt son Kyung Rudd and daughter in law arrived.  For the daughter in law, pt responds with purposeful movements and follows commands for her only.  States this is normal at home and pt is very leery of voices/people she does not know.  Pt opened eyes, raised hands and wiggled toes all to voice commands for daughter in law.  Family states at this time they want to proceed with care and do not wish to withdraw care.

## 2016-01-07 NOTE — Progress Notes (Signed)
4 Days Post-Op  Subjective: On vent  Objective: Vital signs in last 24 hours: Temp:  [98.5 F (36.9 C)-99.3 F (37.4 C)] 98.5 F (36.9 C) (07/26 0723) Pulse Rate:  [25-116] 26 (07/26 0700) Resp:  [14-28] 25 (07/26 0700) BP: (102-137)/(42-55) 126/52 (07/26 0319) SpO2:  [82 %-100 %] 88 % (07/26 0700) FiO2 (%):  [30 %] 30 % (07/26 0400) Last BM Date: 01/06/16  Intake/Output from previous day: 07/25 0701 - 07/26 0700 In: 1932.9 [I.V.:1387.9; Blood:335; NG/GT:60; IV Piggyback:150] Out: 650 [Emesis/NG output:500; Stool:150] Intake/Output this shift: No intake/output data recorded.  Resp: few rales Cardio: regular rate and rhythm GI: soft, VAC on midline, large liquid stool from ileostomy  Lab Results:   Recent Labs  01/06/16 0430 01/07/16 0442  WBC 11.7* 16.1*  HGB 6.9* 7.9*  HCT 20.1* 23.3*  PLT 122* 108*   BMET  Recent Labs  01/06/16 0430 01/07/16 0442  NA 134* 129*  K 4.9 4.8  CL 97* 92*  CO2 23 20*  GLUCOSE 150* 130*  BUN 55* 68*  CREATININE 4.18* 4.51*  CALCIUM 8.5* 8.3*   PT/INR No results for input(s): LABPROT, INR in the last 72 hours. ABG  Recent Labs  01/06/16 0446 01/07/16 0440  PHART 7.526* 7.508*  HCO3 26.9* 23.3    Studies/Results: Dg Chest Port 1 View  Result Date: 01/07/2016 CLINICAL DATA:  Respiratory failure. EXAM: PORTABLE CHEST 1 VIEW COMPARISON:  01/05/2016. FINDINGS: Endotracheal tube, NG tube, right IJ line in stable position. Mediastinum and hilar structures normal. Heart size normal. No focal pulmonary infiltrate. Low lung volumes with mild basilar atelectasis. No pleural effusion. Left-sided approximately 25% pneumothorax noted. Some degree of tension appears to be present. a IMPRESSION: 1. Tension pneumothorax on the left. Pneumothorax is approximately 25% . 2. Lines and tubes in stable position. Low lung volumes with basilar atelectasis. Critical Value/emergent results were called by telephone at the time of interpretation on  01/07/2016 at 7:22 am to nurse Anderson Malta who verbally acknowledged these results. Electronically Signed   By: Marcello Moores  Register   On: 01/07/2016 07:24   Anti-infectives: Anti-infectives    Start     Dose/Rate Route Frequency Ordered Stop   01/06/16 1400  piperacillin-tazobactam (ZOSYN) IVPB 2.25 g     2.25 g 100 mL/hr over 30 Minutes Intravenous Every 8 hours 01/06/16 1052     01/06/16 1200  piperacillin-tazobactam (ZOSYN) IVPB 2.25 g  Status:  Discontinued     2.25 g 100 mL/hr over 30 Minutes Intravenous Every 6 hours 01/06/16 1050 01/06/16 1052   01/07/2016 2200  piperacillin-tazobactam (ZOSYN) IVPB 2.25 g  Status:  Discontinued     2.25 g 100 mL/hr over 30 Minutes Intravenous Every 8 hours 12/17/2015 1654 01/06/16 1050   01/06/2016 1715  vancomycin (VANCOCIN) 500 mg in sodium chloride 0.9 % 100 mL IVPB     500 mg 100 mL/hr over 60 Minutes Intravenous  Once 12/19/2015 1654 12/25/2015 1835   01/08/2016 1615  piperacillin-tazobactam (ZOSYN) IVPB 3.375 g     3.375 g 100 mL/hr over 30 Minutes Intravenous  Once 01/07/2016 1613 01/12/2016 1730   12/13/2015 1615  vancomycin (VANCOCIN) IVPB 1000 mg/200 mL premix     1,000 mg 200 mL/hr over 60 Minutes Intravenous  Once 12/13/2015 1613 12/30/2015 1825      Assessment/Plan: s/p Procedure(s): EXPLORATORY LAPAROTOMY (N/A) DISTAL ILEUM AND COLON RESECTION RIGHT (Right) TERMINAL ILEOSTOMY WITH HARTMANS POUCH (Right) POD#4 Start TF today - I D/W CCM team VAC on wound Noted goals  of care per family  LOS: 4 days    Shelly Cain E 01/07/2016

## 2016-01-07 NOTE — Progress Notes (Signed)
Call received from Radiologist, tension pneumothorax on left.  Will notify MD.

## 2016-01-07 NOTE — Procedures (Signed)
Chest Tube Insertion Procedure Note  Indications:  Clinically significant Pneumothorax  Pre-operative Diagnosis: Pneumothorax  Post-operative Diagnosis: Pneumothorax  Procedure Details  Informed consent was obtained for the procedure, including sedation.  Risks of lung perforation, hemorrhage, arrhythmia, and adverse drug reaction were discussed.   After sterile skin prep, using standard technique, a 14 French tube was placed in the left lateral  rib space at the nipple line  Findings: Immediate airleak  Estimated Blood Loss:  Minimal         Specimens:  None              Complications:  None; patient tolerated the procedure well.         Disposition: ICU - intubated and critically ill.         Condition: stable  Erick Colace ACNP-BC [place] Pager # [phone] OR # [phone] if no answer  Rush Farmer, M.D. [facility] Pulmonary/Critical Care Medicine. Pager: [phone]. After hours pager: [phone].

## 2016-01-07 NOTE — Progress Notes (Signed)
PULMONARY / CRITICAL CARE MEDICINE   Name: Sarayah Corio MRN: [id number] DOB: 05-21-1935    ADMISSION DATE:  12/25/2015 CONSULTATION DATE:  12/20/2015  REFERRING MD:  EDP  CHIEF COMPLAINT:  Abdominal pain  HISTORY OF PRESENT ILLNESS:   Ms. Novinger is a 50F with PMH significant for ESRD on iHD TThSa, HTN, hx colon cancer, diabetes, and dementia who presented to the ED with abd pain on HD  A CT abdomen was obtained that showed free air in the abdomen with a likely SBO with transition point - no clear site of perforation, however. Her exam was notable for diffuse tenderness with guarding, but no rebound. LA was 8.25 on presentation. Underwent  exploratory laparotomy for bowel necrosis and required bowel resection, extensive washout and end ileostomy.   SUBJECTIVE:  Levophed decreasing, but still requiring for hypotension. Left pneumothorax, will place CT today. Will need central line placed for continued pressor requirement.   VITAL SIGNS: BP (!) 126/52   Pulse (!) 26   Temp 98.5 F (36.9 C) (Axillary)   Resp (!) 25   Wt 148 lb 13 oz (67.5 kg)   SpO2 (!) 88%   BMI 24.02 kg/m   HEMODYNAMICS:    VENTILATOR SETTINGS: Vent Mode: PRVC FiO2 (%):  [30 %] 30 % Set Rate:  [24 bmp] 24 bmp Vt Set:  [500 mL] 500 mL PEEP:  [5 cmH20] 5 cmH20 Plateau Pressure:  [17 cmH20-19 cmH20] 19 cmH20  INTAKE / OUTPUT: I/O last 3 completed shifts: In: 3091.1 [I.V.:2306.1; Blood:335; NG/GT:150; IV Piggyback:300] Out: [id number] [Emesis/NG output:700; Stool:475]  PHYSICAL EXAMINATION:  General Chronically ill appearing, intubated, having spontaneous movement of LUE  HEENT No gross abnormalities. ETT/OGT in place  Pulmonary Diffuse bilaterally  ronchi. Good effort, symmetrical expansion. Occasional ett air leak  Cardiovascular Normal rate, regular rhythm. S1, s2. No m/r/g. Distal pulses palpable.  Abdomen Soft. Incisional wound vac in place. Stoma pink with ileostomy output. Mild tenderness to palpation.  +BS.   Musculoskeletal Grossly normal.   Lymphatics No cervical, supraclavicular or axillary adenopathy.   Neurologic No eye opening, spontaneous movement of LUE, does not follow commands  Skin/Integuement No rash, no cyanosis, no clubbing. No edema. Right upper chest PermCath.      LABS:  BMET  Recent Labs Lab 01/05/16 0945 01/06/16 0430 01/07/16 0442  NA 138 134* 129*  K 4.8 4.9 4.8  CL 100* 97* 92*  CO2 22 23 20*  BUN 26* 55* 68*  CREATININE 2.56* 4.18* 4.51*  GLUCOSE 67 150* 130*    Electrolytes  Recent Labs Lab 01/05/16 0945 01/06/16 0430 01/07/16 0442  CALCIUM 12.2* 8.5* 8.3*  MG  --   --  2.0  PHOS  --   --  5.9*    CBC  Recent Labs Lab 01/05/16 0945 01/06/16 0430 01/07/16 0442  WBC 10.9* 11.7* 16.1*  HGB 7.4* 6.9* 7.9*  HCT 22.9* 20.1* 23.3*  PLT 145* 122* 108*    Coag's No results for input(s): APTT, INR in the last 168 hours.  Sepsis Markers  Recent Labs Lab 12/21/2015 1552 12/18/2015 1851  LATICACIDVEN 8.25* 4.43*    ABG  Recent Labs Lab 01/05/16 1010 01/06/16 0446 01/07/16 0440  PHART 7.460* 7.526* 7.508*  PCO2ART 34.0* 32.3* 29.4*  PO2ART 535.0* 202.0* 141.0*    Liver Enzymes  Recent Labs Lab 12/21/2015 1544 01/06/16 0430  AST 50* 382*  ALT 39 343*  ALKPHOS 39 73  BILITOT 0.6 0.6  ALBUMIN 3.8 2.3*    Cardiac  Enzymes  Recent Labs Lab 01/05/16 0945  TROPONINI 1.00*    Glucose  Recent Labs Lab 01/06/16 1128 01/06/16 1527 01/06/16 1947 01/07/16 0002 01/07/16 0410 01/07/16 0720  GLUCAP 152* 178* 134* 144* 110* 157*    Imaging Dg Chest Port 1 View  Result Date: 01/07/2016 CLINICAL DATA:  Respiratory failure. EXAM: PORTABLE CHEST 1 VIEW COMPARISON:  01/05/2016. FINDINGS: Endotracheal tube, NG tube, right IJ line in stable position. Mediastinum and hilar structures normal. Heart size normal. No focal pulmonary infiltrate. Low lung volumes with mild basilar atelectasis. No pleural effusion. Left-sided  approximately 25% pneumothorax noted. Some degree of tension appears to be present. a IMPRESSION: 1. Tension pneumothorax on the left. Pneumothorax is approximately 25% . 2. Lines and tubes in stable position. Low lung volumes with basilar atelectasis. Critical Value/emergent results were called by telephone at the time of interpretation on 01/07/2016 at 7:22 am to nurse Anderson Malta who verbally acknowledged these results. Electronically Signed   By: Marcello Moores  Register   On: 01/07/2016 07:24    STUDIES:  CT abd 7/22 >> SBO, free air  CULTURES: Blood 7/22 >> NGTD Urine 7/22 >> NGTD  ANTIBIOTICS: Zosyn 7/22 >> Vancomycin 7/22 >> 7/22  SIGNIFICANT EVENTS: Distal ileum and colon resection; end ileostomy w/ [place] pouch 7/24 PEA arrest 7/26 Left pneumothorax, chest tube placement and central line placement   LINES/TUBES: PIV VasCath L IJ TLC 7/26 >> Left CT 7/26 >>  DISCUSSION: Ms. Doehring is a 89F w/ PMH significant for ESRD on iHD TTSa, DMII, dementia, HTN, hx colon ca, who presented with acute small bowel obstruction and perforation and was taken urgently to the OR for exploratory laparotomy with bowel resection and end ileostomy. Had sudden PEA arrest this am. ROSC 9-10 minutes. Occurred about 2 hours into HD. Suspect that this was metabolically mediated. We treated her for presumptive hyperkalemia and acidosis during the arrest. Prognosis is poor and she has significant co-morbids including ESRD, dementia and resides in SNF.  Reasonable to continue current levels of supportive care, abx, wean pressors. Will not escalate level of pressors. Would NOT code again. Continue to wean pressors and ventilator as able. Consider MRI for evidence of anoxic brain injury. Today's plan: place Left CT for pneumo, place central line for continued pressor requirement. Continue to wean pressors for MAP > 60. Start TF per general surgery.  ASSESSMENT / PLAN:  GASTROINTESTINAL A:   Acute SBO w/ perforation s/p  terminal ileum and colon resection with end ileostomy P:   Zosyn Start TF today per surgery Monitor ostomy output Pain control  PULMONARY A: Ventilator dependence. P:   Full vent support, decrease RR to 16 for respiratory alkalosis. Begin weaning as tolerated Metabolic alkalosis on ABG CXR >> left pneumo on AM CXR Will place left CT for pneumo Cholrhexidine for VAP prevention ABG/CXR in the AM  CARDIOVASCULAR A:  Post-procedural hypotension requiring neosynephrine Hx hypertension PEA arrest (time to ROSC 9-10 minutes)-->post-cardiac arrest shock  P:  Titrate levophed for MAP >60 (will not increase Levo over 15 mcgs) DNR in the setting of arrest, will continue pressors as needed Levophed down to 6 mcg.   RENAL A:   ESRD on iHD TTSa Hyperkalemia-->improved P:   Nephrology following, not CRRT candidate Dialysis today. Monitor lytes  HEMATOLOGIC A:   Acute blood loss anemia - Hb drop from 11 to 7.3. Not sure how much of this is blood loss and how much is dilutional  P:  Monitor & transfuse on HD if <  7 Last transfusion 7/25 Hold LMWH until HgB stabilizes Place SCDs CBC in the AM  INFECTIOUS A:   Concern for evolving intraabdominal sepsis s/p bowel perforation-->could be bacterial translocation  Leukocytosis P:   Zosyn day 3/x Persistent leukocytosis Remains afebrile Plan to culture for temp > 101.5 Send CBC with dif  In the AM  ENDOCRINE A:   Hypoglycemia post-operatively (iatrogenic) Hx DMII  Hypothyroidism  P:   Follow CBG Maintain euglycemia Continue synthroid  NEUROLOGIC A:   Advanced dementia; Pain P:   RASS goal: 0 Morphine prn  FAMILY  - Updates: No family at bedside.   - Inter-disciplinary family meet or Palliative Care meeting due by:  day 7  Attending Note:  81 year old dialysis patient who suffered a cardiac arrest 7/24.  She is not pressor dependent.  Family does not wish for any escalation of care but no withdrawal for now.   On exam, unresponsive with coarse BS diffusely. Decrease RT to 16.  Begin PS trials.  Levo for BP support.  Limit levo to 15.  Dialysis today.  PTX on CXR, will place [place] catheter.  Replace TLC for levophed.  Continue abx and f/u on cultures.  Depending on her rate of progress will determine need for palliation.  The patient is critically ill with multiple organ systems failure and requires high complexity decision making for assessment and support, frequent evaluation and titration of therapies, application of advanced monitoring technologies and extensive interpretation of multiple databases.   Critical Care Time devoted to patient care services described in this note is  35  Minutes. This time reflects time of care of this signee Dr Jennet Maduro. This critical care time does not reflect procedure time, or teaching time or supervisory time of PA/NP/Med student/Med Resident etc but could involve care discussion time.  Rush Farmer, M.D. [facility] Pulmonary/Critical Care Medicine. Pager: [phone]. After hours pager: [phone].

## 2016-01-08 ENCOUNTER — Inpatient Hospital Stay (HOSPITAL_COMMUNITY): Payer: Medicare (Managed Care)

## 2016-01-08 DIAGNOSIS — Z7189 Other specified counseling: Secondary | ICD-10-CM

## 2016-01-08 LAB — CULTURE, BLOOD (ROUTINE X 2)
Culture: NO GROWTH
Culture: NO GROWTH

## 2016-01-08 LAB — BLOOD GAS, ARTERIAL
Acid-base deficit: 5.2 mmol/L — ABNORMAL HIGH (ref 0.0–2.0)
BICARBONATE: 18.9 meq/L — AB (ref 20.0–24.0)
Drawn by: 460981
FIO2: 0.3
LHR: 16 {breaths}/min
MECHVT: 500 mL
O2 Saturation: 97.8 %
PEEP/CPAP: 5 cmH2O
Patient temperature: 98.6
TCO2: 19.9 mmol/L (ref 0–100)
pCO2 arterial: 32.1 mmHg — ABNORMAL LOW (ref 35.0–45.0)
pH, Arterial: 7.389 (ref 7.350–7.450)
pO2, Arterial: 150 mmHg — ABNORMAL HIGH (ref 80.0–100.0)

## 2016-01-08 LAB — BASIC METABOLIC PANEL
ANION GAP: 16 — AB (ref 5–15)
BUN: 83 mg/dL — ABNORMAL HIGH (ref 6–20)
CO2: 20 mmol/L — ABNORMAL LOW (ref 22–32)
Calcium: 7.6 mg/dL — ABNORMAL LOW (ref 8.9–10.3)
Chloride: 90 mmol/L — ABNORMAL LOW (ref 101–111)
Creatinine, Ser: 5.08 mg/dL — ABNORMAL HIGH (ref 0.44–1.00)
GFR, EST AFRICAN AMERICAN: 8 mL/min — AB (ref >60)
GFR, EST NON AFRICAN AMERICAN: 7 mL/min — AB (ref >60)
Glucose, Bld: 137 mg/dL — ABNORMAL HIGH (ref 65–99)
POTASSIUM: 4.9 mmol/L (ref 3.5–5.1)
SODIUM: 126 mmol/L — AB (ref 135–145)

## 2016-01-08 LAB — CBC WITH DIFFERENTIAL/PLATELET
BLASTS: 0 %
Band Neutrophils: 0 %
Basophils Absolute: 0 10*3/uL (ref 0.0–0.1)
Basophils Relative: 0 %
EOS PCT: 1 %
Eosinophils Absolute: 0.2 10*3/uL (ref 0.0–0.7)
HEMATOCRIT: 22 % — AB (ref 36.0–46.0)
Hemoglobin: 7.6 g/dL — ABNORMAL LOW (ref 12.0–15.0)
LYMPHS ABS: 1 10*3/uL (ref 0.7–4.0)
LYMPHS PCT: 6 %
MCH: 28.5 pg (ref 26.0–34.0)
MCHC: 34.5 g/dL (ref 30.0–36.0)
MCV: 82.4 fL (ref 78.0–100.0)
METAMYELOCYTES PCT: 0 %
MONO ABS: 0.8 10*3/uL (ref 0.1–1.0)
MONOS PCT: 5 %
Myelocytes: 0 %
NEUTROS ABS: 14.7 10*3/uL — AB (ref 1.7–7.7)
Neutrophils Relative %: 88 %
PLATELETS: 81 10*3/uL — AB (ref 150–400)
Promyelocytes Absolute: 0 %
RBC: 2.67 MIL/uL — AB (ref 3.87–5.11)
RDW: 16 % — AB (ref 11.5–15.5)
WBC: 16.7 10*3/uL — AB (ref 4.0–10.5)
nRBC: 0 /100 WBC

## 2016-01-08 LAB — GLUCOSE, CAPILLARY
GLUCOSE-CAPILLARY: 147 mg/dL — AB (ref 65–99)
GLUCOSE-CAPILLARY: 161 mg/dL — AB (ref 65–99)
GLUCOSE-CAPILLARY: 171 mg/dL — AB (ref 65–99)
GLUCOSE-CAPILLARY: 208 mg/dL — AB (ref 65–99)
Glucose-Capillary: 146 mg/dL — ABNORMAL HIGH (ref 65–99)
Glucose-Capillary: 203 mg/dL — ABNORMAL HIGH (ref 65–99)

## 2016-01-08 LAB — MAGNESIUM: MAGNESIUM: 1.9 mg/dL (ref 1.7–2.4)

## 2016-01-08 LAB — PHOSPHORUS: Phosphorus: 6.9 mg/dL — ABNORMAL HIGH (ref 2.5–4.6)

## 2016-01-08 NOTE — Progress Notes (Signed)
Arrived to patient room 2S-02 at 1030.  Reviewed treatment plan and this RN agrees with plan.  Report received from bedside RN, York Cerise.  Consent verified.  Patient Intubated, opens eyes to voice.   Lung sounds diminished to ausculation in all fields. Generalized edema. Cardiac:  NSR.  Removed caps and cleansed RIJ catheter with chlorhedxidine.  Aspirated ports of heparin and flushed them with saline per protocol.  Connected and secured lines, initiated treatment at 1042.  UF Goal of 3L and net fluid removal 2.5L.  Will continue to monitor.

## 2016-01-08 NOTE — Care Management Important Message (Signed)
Important Message  Patient Details  Name: Shelly Cain MRN: [id number] Date of Birth: Feb 26, 1935   Medicare Important Message Given:  Yes    Nathen May 01/08/2016, 12:05 PM

## 2016-01-08 NOTE — Progress Notes (Addendum)
While bathing pt and turning pt vomited. Tube feeds were on hold at the time. Small amount suctioned out orally and none noted while during airway suctioning. Elink notified and tube feeds to be held until the AM.  Eleonore Chiquito RN [place]

## 2016-01-08 NOTE — Progress Notes (Signed)
5 Days Post-Op  Subjective: On vent. Tachycardia and tachypnea. WBC 16.7, trending up from 11.7 on 01/06/2016. H&H 7.6 & 22. Ostomy producing. Wound vac to be changed tomorrow.   Objective: Vital signs in last 24 hours: Temp:  [96.9 F (36.1 C)-98.7 F (37.1 C)] 96.9 F (36.1 C) (07/27 1213) Pulse Rate:  [25-147] 99 (07/27 1200) Resp:  [15-32] 28 (07/27 1200) BP: (104-183)/(17-59) 134/45 (07/27 1200) SpO2:  [85 %-100 %] 100 % (07/27 1200) FiO2 (%):  [30 %] 30 % (07/27 1200) Weight:  [138 lb 10.7 oz (62.9 kg)] 138 lb 10.7 oz (62.9 kg) (07/27 1030) Last BM Date: 01/08/16  Intake/Output from previous day: 07/26 0701 - 07/27 0700 In: 1774 [I.V.:1024; NG/GT:600; IV Piggyback:150] Out: 2295 [Stool:2275; Chest Tube:20] Intake/Output this shift: Total I/O In: 360 [I.V.:150; NG/GT:210] Out: 100 [Stool:100]  General appearance  Supine. On vent. Head: Normocephalic/atraumatic. Marland Kitchen  Neck: FROM Cardiovascular: Tachycardic without gallops murmurs or rubs Respiratory: Tachypnea. Lung sounds vesicular. No wheezes, crackles or rhonchi auscultated Abdomen: Soft and non tender without guarding, masses, or rigidity. Viable ostomy visualized. Wound vac in place. To be changed 2016-01-15. +BS   Lab Results:   Recent Labs  01/07/16 0442 01/08/16 0327  WBC 16.1* 16.7*  HGB 7.9* 7.6*  HCT 23.3* 22.0*  PLT 108* 81*   BMET  Recent Labs  01/07/16 0442 01/08/16 0327  NA 129* 126*  K 4.8 4.9  CL 92* 90*  CO2 20* 20*  GLUCOSE 130* 137*  BUN 68* 83*  CREATININE 4.51* 5.08*  CALCIUM 8.3* 7.6*   PT/INR No results for input(s): LABPROT, INR in the last 72 hours. ABG  Recent Labs  01/07/16 0440 01/08/16 0530  PHART 7.508* 7.389  HCO3 23.3 18.9*    Studies/Results: Dg Chest Port 1 View  Result Date: 01/08/2016 CLINICAL DATA:  Respiratory failure. EXAM: PORTABLE CHEST 1 VIEW COMPARISON:  01/07/2016 . FINDINGS: Endotracheal tube, NG tube, right IJ line left IJ line,, left  chest tube in stable position. Heart size normal. Low lung volumes with mild bibasilar atelectasis and/or infiltrate. No pleural effusion or pneumothorax. Prominent skin fold noted on the left. IMPRESSION: 1. Lines and tubes including left chest tube in stable position. No pneumothorax. 2. Low lung volumes with mild bibasilar atelectasis and/or infiltrates . Electronically Signed   By: Marcello Moores  Register   On: 01/08/2016 07:32  Dg Chest Port 1 View  Result Date: 01/07/2016 CLINICAL DATA:  Acute respiratory failure. EXAM: PORTABLE CHEST 1 VIEW COMPARISON:  01/07/2016 FINDINGS: 1016 hours. Endotracheal tube tip is 4.4 cm above the base of the carina. Right IJ central line tip projects at the proximal SVC level. There is a left IJ central line which is new in the interval and crosses the midline with the tip positioned in the right subclavian vein. NG tube tip overlies the gastric fundus. Left chest tube is new in the interval with evacuation of previously seen left pneumothorax. No focal airspace consolidation. No pulmonary edema or pleural effusion. IMPRESSION: 1. New left IJ central venous catheter tip crosses the midline with the tip positioned in the right subclavian vein. 2. Interval placement of left-sided chest tube with no residual pneumothorax evident. I discussed these findings by telephone with Marni Griffon shortly after the exam was interpreted. Electronically Signed   By: Misty Stanley M.D.   On: 01/07/2016 11:04  Dg Chest Port 1 View  Result Date: 01/07/2016 CLINICAL DATA:  Respiratory failure. EXAM: PORTABLE CHEST 1 VIEW COMPARISON:  01/05/2016. FINDINGS: Endotracheal tube, NG tube, right IJ line in stable position. Mediastinum and hilar structures normal. Heart size normal. No focal pulmonary infiltrate. Low lung volumes with mild basilar atelectasis. No pleural effusion. Left-sided approximately 25% pneumothorax noted. Some degree of tension appears to be present. a IMPRESSION: 1. Tension  pneumothorax on the left. Pneumothorax is approximately 25% . 2. Lines and tubes in stable position. Low lung volumes with basilar atelectasis. Critical Value/emergent results were called by telephone at the time of interpretation on 01/07/2016 at 7:22 am to nurse Anderson Malta who verbally acknowledged these results. Electronically Signed   By: Marcello Moores  Register   On: 01/07/2016 07:24   Anti-infectives: Anti-infectives    Start     Dose/Rate Route Frequency Ordered Stop   01/06/16 1400  piperacillin-tazobactam (ZOSYN) IVPB 2.25 g     2.25 g 100 mL/hr over 30 Minutes Intravenous Every 8 hours 01/06/16 1052     01/06/16 1200  piperacillin-tazobactam (ZOSYN) IVPB 2.25 g  Status:  Discontinued     2.25 g 100 mL/hr over 30 Minutes Intravenous Every 6 hours 01/06/16 1050 01/06/16 1052   12/30/2015 2200  piperacillin-tazobactam (ZOSYN) IVPB 2.25 g  Status:  Discontinued     2.25 g 100 mL/hr over 30 Minutes Intravenous Every 8 hours 01/11/2016 1654 01/06/16 1050   12/26/2015 1715  vancomycin (VANCOCIN) 500 mg in sodium chloride 0.9 % 100 mL IVPB     500 mg 100 mL/hr over 60 Minutes Intravenous  Once 12/19/2015 1654 12/24/2015 1835   01/05/2016 1615  piperacillin-tazobactam (ZOSYN) IVPB 3.375 g     3.375 g 100 mL/hr over 30 Minutes Intravenous  Once 12/29/2015 1613 01/02/2016 1730   01/05/2016 1615  vancomycin (VANCOCIN) IVPB 1000 mg/200 mL premix     1,000 mg 200 mL/hr over 60 Minutes Intravenous  Once 01/08/2016 1613 12/15/2015 1825      Assessment/Plan: Internal hernia with ischemic bowel S/P EXPLORATORY LAPAROTOMY DISTAL ILEUM AND COLON RESECTION RIGHT (Right) TERMINAL ILEOSTOMY WITH HARTMANS POUCH (Right) By Dr. Ralene Ok on January 04, 2016  [facility] on wound. To be changed 2016-01-25.  FEN: IV fluids, NPO,Tube Feeds   ID: Zosyn Day #2 WBC currently 16.7, trending up. VTE: SCDs Dispo: Continue tube feeds and IV antibiotics.   Lannie Fields PASII 01/08/2016

## 2016-01-08 NOTE — Progress Notes (Signed)
Dialysis treatment completed.  1264 mL ultrafiltrated.  664 mL net fluid removal.  Patient status unchanged. Lung sounds diminished to ausculation in all fields. No edema. Cardiac: ST.  Cleansed RIJ catheter with chlorhexidine.  Disconnected lines and flushed ports with saline per protocol.  Ports locked with heparin and capped per protocol.    Report given to bedside, RN York Cerise.

## 2016-01-08 NOTE — Progress Notes (Signed)
PULMONARY / CRITICAL CARE MEDICINE   Name: Shelly Cain MRN: [id number] DOB: 03-27-1935    ADMISSION DATE:  12/31/2015 CONSULTATION DATE:  01/10/2016  REFERRING MD:  EDP  CHIEF COMPLAINT:  Abdominal pain  HISTORY OF PRESENT ILLNESS:   Shelly Cain is a 76F with PMH significant for ESRD on iHD TThSa, HTN, hx colon cancer, diabetes, and dementia who presented to the ED with abd pain on HD  A CT abdomen was obtained that showed free air in the abdomen with a likely SBO with transition point - no clear site of perforation, however. Her exam was notable for diffuse tenderness with guarding, but no rebound. LA was 8.25 on presentation. Underwent  exploratory laparotomy for bowel necrosis and required bowel resection, extensive washout and end ileostomy.   SUBJECTIVE:  Off pressors, no events overnight.    VITAL SIGNS: BP (!) 173/42   Pulse 79   Temp 97.2 F (36.2 C) (Axillary)   Resp (!) 31   Ht 5\' 6"  (1.676 m)   Wt 62.9 kg (138 lb 10.7 oz)   SpO2 100%   BMI 22.38 kg/m   HEMODYNAMICS:    VENTILATOR SETTINGS: Vent Mode: CPAP;PSV FiO2 (%):  [30 %] 30 % Set Rate:  [16 bmp] 16 bmp Vt Set:  [500 mL] 500 mL PEEP:  [5 cmH20] 5 cmH20 Pressure Support:  [10 cmH20] 10 cmH20 Plateau Pressure:  [17 cmH20-18 cmH20] 18 cmH20  INTAKE / OUTPUT: I/O last 3 completed shifts: In: 2448.9 [I.V.:1568.9; NG/GT:630; IV Piggyback:250] Out: 2645 [Emesis/NG output:300; Stool:2325; Chest Tube:20]  PHYSICAL EXAMINATION:  General Chronically ill appearing, intubated, having spontaneous movement of LUE  HEENT No gross abnormalities. ETT/OGT in place  Pulmonary Diffuse bilaterally  ronchi. Good effort, symmetrical expansion. Occasional ett air leak  Cardiovascular Normal rate, regular rhythm. S1, s2. No m/r/g. Distal pulses palpable.  Abdomen Soft. Incisional wound vac in place. Stoma pink with ileostomy output. Mild tenderness to palpation. +BS.   Musculoskeletal Grossly normal.   Lymphatics No  cervical, supraclavicular or axillary adenopathy.   Neurologic No eye opening, spontaneous movement of LUE, does not follow commands  Skin/Integuement No rash, no cyanosis, no clubbing. No edema. Right upper chest PermCath.    LABS:  BMET  Recent Labs Lab 01/06/16 0430 01/07/16 0442 01/08/16 0327  NA 134* 129* 126*  K 4.9 4.8 4.9  CL 97* 92* 90*  CO2 23 20* 20*  BUN 55* 68* 83*  CREATININE 4.18* 4.51* 5.08*  GLUCOSE 150* 130* 137*    Electrolytes  Recent Labs Lab 01/06/16 0430 01/07/16 0442 01/08/16 0327  CALCIUM 8.5* 8.3* 7.6*  MG  --  2.0 1.9  PHOS  --  5.9* 6.9*    CBC  Recent Labs Lab 01/06/16 0430 01/07/16 0442 01/08/16 0327  WBC 11.7* 16.1* 16.7*  HGB 6.9* 7.9* 7.6*  HCT 20.1* 23.3* 22.0*  PLT 122* 108* 81*    Coag's No results for input(s): APTT, INR in the last 168 hours.  Sepsis Markers  Recent Labs Lab 01/12/2016 1552 12/16/2015 1851  LATICACIDVEN 8.25* 4.43*    ABG  Recent Labs Lab 01/06/16 0446 01/07/16 0440 01/08/16 0530  PHART 7.526* 7.508* 7.389  PCO2ART 32.3* 29.4* 32.1*  PO2ART 202.0* 141.0* 150*    Liver Enzymes  Recent Labs Lab 12/25/2015 1544 01/06/16 0430  AST 50* 382*  ALT 39 343*  ALKPHOS 39 73  BILITOT 0.6 0.6  ALBUMIN 3.8 2.3*    Cardiac Enzymes  Recent Labs Lab 01/05/16 0945  TROPONINI  1.00*    Glucose  Recent Labs Lab 01/07/16 0720 01/07/16 1123 01/07/16 1530 01/07/16 1942 01/07/16 2353 01/08/16 0404  GLUCAP 157* 145* 111* 149* 150* 147*    Imaging Dg Chest Port 1 View  Result Date: 01/08/2016 CLINICAL DATA:  Respiratory failure. EXAM: PORTABLE CHEST 1 VIEW COMPARISON:  01/07/2016 . FINDINGS: Endotracheal tube, NG tube, right IJ line left IJ line,, left chest tube in stable position. Heart size normal. Low lung volumes with mild bibasilar atelectasis and/or infiltrate. No pleural effusion or pneumothorax. Prominent skin fold noted on the left. IMPRESSION: 1. Lines and tubes including left  chest tube in stable position. No pneumothorax. 2. Low lung volumes with mild bibasilar atelectasis and/or infiltrates . Electronically Signed   By: Marcello Moores  Register   On: 01/08/2016 07:32  Dg Chest Port 1 View  Result Date: 01/07/2016 CLINICAL DATA:  Acute respiratory failure. EXAM: PORTABLE CHEST 1 VIEW COMPARISON:  01/07/2016 FINDINGS: 1016 hours. Endotracheal tube tip is 4.4 cm above the base of the carina. Right IJ central line tip projects at the proximal SVC level. There is a left IJ central line which is new in the interval and crosses the midline with the tip positioned in the right subclavian vein. NG tube tip overlies the gastric fundus. Left chest tube is new in the interval with evacuation of previously seen left pneumothorax. No focal airspace consolidation. No pulmonary edema or pleural effusion. IMPRESSION: 1. New left IJ central venous catheter tip crosses the midline with the tip positioned in the right subclavian vein. 2. Interval placement of left-sided chest tube with no residual pneumothorax evident. I discussed these findings by telephone with Marni Griffon shortly after the exam was interpreted. Electronically Signed   By: Misty Stanley M.D.   On: 01/07/2016 11:04    STUDIES:  CT abd 7/22 >> SBO, free air  CULTURES: Blood 7/22 >> NGTD Urine 7/22 >> NGTD  ANTIBIOTICS: Zosyn 7/22 >> Vancomycin 7/22 >> 7/22  SIGNIFICANT EVENTS: Distal ileum and colon resection; end ileostomy w/ [place] pouch 7/24 PEA arrest 7/26 Left pneumothorax, chest tube placement and central line placement   LINES/TUBES: PIV VasCath L IJ TLC 7/26 >> Left CT 7/26 >> ETT 7/22>>>  DISCUSSION: Shelly Cain is a 20F w/ PMH significant for ESRD on iHD TTSa, DMII, dementia, HTN, hx colon ca, who presented with acute small bowel obstruction and perforation and was taken urgently to the OR for exploratory laparotomy with bowel resection and end ileostomy. Had sudden PEA arrest this am. ROSC 9-10 minutes.  Occurred about 2 hours into HD. Suspect that this was metabolically mediated. We treated her for presumptive hyperkalemia and acidosis during the arrest. Prognosis is poor and she has significant co-morbids including ESRD, dementia and resides in SNF.  Reasonable to continue current levels of supportive care, abx, wean pressors. Will not escalate level of pressors. Would NOT code again. Continue to wean pressors and ventilator as able. Consider MRI for evidence of anoxic brain injury. Today's plan: place Left CT for pneumo, place central line for continued pressor requirement. Continue to wean pressors for MAP > 60. Start TF per general surgery.  ASSESSMENT / PLAN:  GASTROINTESTINAL A:   Acute SBO w/ perforation s/p terminal ileum and colon resection with end ileostomy P:   Zosyn TF per nutrition Monitor ostomy output Pain control  PULMONARY A: Ventilator dependence. P:   Begin weaning as tolerated Metabolic alkalosis on ABG CXR >> left pneumo on AM CXR Will place left CT for  pneumo Cholrhexidine for VAP prevention ABG/CXR in the AM  CARDIOVASCULAR A:  Post-procedural hypotension requiring neosynephrine Hx hypertension PEA arrest (time to ROSC 9-10 minutes)-->post-cardiac arrest shock  P:  Titrate levophed for MAP >60 (will not increase Levo over 15 mcgs) DNR in the setting of arrest, will continue pressors as needed Levophed off.   RENAL A:   ESRD on iHD TTSa Hyperkalemia-->improved P:   Nephrology following, not CRRT candidate Dialysis today. Monitor lytes  HEMATOLOGIC A:   Acute blood loss anemia - Hb drop from 11 to 7.3. Not sure how much of this is blood loss and how much is dilutional  P:  Monitor & transfuse on HD if <7 Last transfusion 7/25 Hold LMWH until HgB stabilizes SCDs CBC in the AM  INFECTIOUS A:   Concern for evolving intraabdominal sepsis s/p bowel perforation-->could be bacterial translocation  Leukocytosis P:   Zosyn day 4/x Persistent  leukocytosis Remains afebrile Plan to culture for temp > 101.5 Send CBC with dif  In the AM  ENDOCRINE A:   Hypoglycemia post-operatively (iatrogenic) Hx DMII  Hypothyroidism  P:   Follow CBG Maintain euglycemia Continue synthroid  NEUROLOGIC A:   Advanced dementia; Pain P:   RASS goal: 0 Morphine prn  FAMILY  - Updates: No family at bedside.  Understanding is withdrawal of ETT in AM.  - Inter-disciplinary family meet or Palliative Care meeting due by:  day 7  The patient is critically ill with multiple organ systems failure and requires high complexity decision making for assessment and support, frequent evaluation and titration of therapies, application of advanced monitoring technologies and extensive interpretation of multiple databases.   Critical Care Time devoted to patient care services described in this note is  35  Minutes. This time reflects time of care of this signee Dr Jennet Maduro. This critical care time does not reflect procedure time, or teaching time or supervisory time of PA/NP/Med student/Med Resident etc but could involve care discussion time.  Rush Farmer, M.D. [facility] Pulmonary/Critical Care Medicine. Pager: [phone]. After hours pager: [phone].

## 2016-01-09 ENCOUNTER — Inpatient Hospital Stay (HOSPITAL_COMMUNITY): Payer: Medicare (Managed Care)

## 2016-01-09 LAB — TYPE AND SCREEN
ABO/RH(D): A POS
Antibody Screen: NEGATIVE
UNIT DIVISION: 0
UNIT DIVISION: 0
UNIT DIVISION: 0
Unit division: 0

## 2016-01-09 LAB — POCT I-STAT 3, VENOUS BLOOD GAS (G3P V)
ACID-BASE DEFICIT: 17 mmol/L — AB (ref 0.0–2.0)
Bicarbonate: 8.6 mEq/L — ABNORMAL LOW (ref 20.0–24.0)
O2 SAT: 99 %
PO2 VEN: 140 mmHg — AB (ref 31.0–45.0)
Patient temperature: 97.8
TCO2: 9 mmol/L (ref 0–100)
pCO2, Ven: 19.6 mmHg — ABNORMAL LOW (ref 45.0–50.0)
pH, Ven: 7.248 — ABNORMAL LOW (ref 7.250–7.300)

## 2016-01-09 LAB — CBC
HEMATOCRIT: 20.9 % — AB (ref 36.0–46.0)
HEMOGLOBIN: 7 g/dL — AB (ref 12.0–15.0)
MCH: 29.3 pg (ref 26.0–34.0)
MCHC: 33.5 g/dL (ref 30.0–36.0)
MCV: 87.4 fL (ref 78.0–100.0)
Platelets: 72 10*3/uL — ABNORMAL LOW (ref 150–400)
RBC: 2.39 MIL/uL — AB (ref 3.87–5.11)
RDW: 16.3 % — ABNORMAL HIGH (ref 11.5–15.5)
WBC: 22.7 10*3/uL — AB (ref 4.0–10.5)

## 2016-01-09 LAB — BASIC METABOLIC PANEL
Anion gap: 28 — ABNORMAL HIGH (ref 5–15)
BUN: 41 mg/dL — ABNORMAL HIGH (ref 6–20)
CALCIUM: 8.9 mg/dL (ref 8.9–10.3)
CO2: 9 mmol/L — AB (ref 22–32)
CREATININE: 3.13 mg/dL — AB (ref 0.44–1.00)
Chloride: 93 mmol/L — ABNORMAL LOW (ref 101–111)
GFR calc non Af Amer: 13 mL/min — ABNORMAL LOW (ref >60)
GFR, EST AFRICAN AMERICAN: 15 mL/min — AB (ref >60)
Glucose, Bld: 149 mg/dL — ABNORMAL HIGH (ref 65–99)
Potassium: 5.9 mmol/L — ABNORMAL HIGH (ref 3.5–5.1)
Sodium: 130 mmol/L — ABNORMAL LOW (ref 135–145)

## 2016-01-09 LAB — GLUCOSE, CAPILLARY
GLUCOSE-CAPILLARY: 73 mg/dL (ref 65–99)
Glucose-Capillary: 45 mg/dL — ABNORMAL LOW (ref 65–99)

## 2016-01-09 LAB — POCT I-STAT 3, ART BLOOD GAS (G3+)
Acid-base deficit: 15 mmol/L — ABNORMAL HIGH (ref 0.0–2.0)
Bicarbonate: 10 mEq/L — ABNORMAL LOW (ref 20.0–24.0)
O2 SAT: 99 %
PCO2 ART: 21.1 mmHg — AB (ref 35.0–45.0)
Patient temperature: 97.8
TCO2: 11 mmol/L (ref 0–100)
pH, Arterial: 7.281 — ABNORMAL LOW (ref 7.350–7.450)
pO2, Arterial: 148 mmHg — ABNORMAL HIGH (ref 80.0–100.0)

## 2016-01-09 LAB — MAGNESIUM: MAGNESIUM: 2.5 mg/dL — AB (ref 1.7–2.4)

## 2016-01-09 LAB — PHOSPHORUS: PHOSPHORUS: 7.8 mg/dL — AB (ref 2.5–4.6)

## 2016-01-09 MED ORDER — SODIUM BICARBONATE 8.4 % IV SOLN
100.0000 meq | Freq: Once | INTRAVENOUS | Status: AC
  Administered 2016-01-09: 100 meq via INTRAVENOUS

## 2016-01-12 ENCOUNTER — Telehealth: Payer: Self-pay

## 2016-01-12 NOTE — Telephone Encounter (Signed)
On 01/12/2016 I received a death certificate from [employer] (original). The death certificate is for burial. The patient is a patient of [employer]. The death certificate will be taken to Pulmonary Unit @ [facility] this pm for signature. On 02/11/2016 I received the death certificate back from [facility]. I got the death certificate ready and called the funeral home to let them know the death certificate is ready for pickup.

## 2016-01-13 NOTE — Progress Notes (Signed)
PULMONARY / CRITICAL CARE MEDICINE   Name: Shelly Cain MRN: [id number] DOB: June 14, 1935    ADMISSION DATE:  12/14/2015 CONSULTATION DATE:  12/22/2015  REFERRING MD:  EDP  CHIEF COMPLAINT:  Abdominal pain  HISTORY OF PRESENT ILLNESS:   Shelly Cain is a 64F with PMH significant for ESRD on iHD TThSa, HTN, hx colon cancer, diabetes, and dementia who presented to the ED with abd pain on HD  A CT abdomen was obtained that showed free air in the abdomen with a likely SBO with transition point - no clear site of perforation, however. Her exam was notable for diffuse tenderness with guarding, but no rebound. LA was 8.25 on presentation. Underwent  exploratory laparotomy for bowel necrosis and required bowel resection, extensive washout and end ileostomy.   SUBJECTIVE:  Now in refractory shock w/ progressive acidosis   VITAL SIGNS: BP (!) 123/36   Pulse 71   Temp 97.4 F (36.3 C) (Axillary)   Resp (!) 31   Ht 5\' 6"  (1.676 m)   Wt 141 lb 1.5 oz (64 kg)   SpO2 95%   BMI 22.77 kg/m   HEMODYNAMICS:    VENTILATOR SETTINGS: Vent Mode: PRVC FiO2 (%):  [30 %-33 %] 30 % Set Rate:  [16 bmp] 16 bmp Vt Set:  [500 mL] 500 mL PEEP:  [5 cmH20] 5 cmH20 Plateau Pressure:  [14 cmH20-28 cmH20] 14 cmH20  INTAKE / OUTPUT: I/O last 3 completed shifts: In: 2876.3 [I.V.:1616.3; NG/GT:1060; IV Piggyback:200] Out: [id number] [Emesis/NG output:800; IJ:4873847; Stool:2550; Chest Tube:20]  PHYSICAL EXAMINATION:  General Chronically ill appearing, intubated, remains in shock, now appears terminally ill  HEENT No gross abnormalities. ETT/OGT in place  Pulmonary Diffuse bilaterally  ronchi. Good effort, symmetrical expansion. Occasional ett air leak; left chest tube at 20 cm H2O sxn. No airleak  Cardiovascular Normal rate, regular rhythm. S1, s2. No m/r/g. Distal pulses palpable.  Abdomen Soft. Incisional wound vac in place. Stoma dusky with ileostomy output. Mild tenderness to palpation. +BS.    Musculoskeletal Grossly normal.   Lymphatics No cervical, supraclavicular or axillary adenopathy.   Neurologic No eye opening, spontaneous movement of LUE, does not follow commands  Skin/Integuement No rash, no cyanosis, no clubbing. No edema. Right upper chest PermCath.    LABS:  BMET  Recent Labs Lab 01/07/16 0442 01/08/16 0327 01-21-16 0400  NA 129* 126* 130*  K 4.8 4.9 5.9*  CL 92* 90* 93*  CO2 20* 20* 9*  BUN 68* 83* 41*  CREATININE 4.51* 5.08* 3.13*  GLUCOSE 130* 137* 149*    Electrolytes  Recent Labs Lab 01/07/16 0442 01/08/16 0327 2016/01/21 0400  CALCIUM 8.3* 7.6* 8.9  MG 2.0 1.9 2.5*  PHOS 5.9* 6.9* 7.8*    CBC  Recent Labs Lab 01/07/16 0442 01/08/16 0327 01-21-2016 0400  WBC 16.1* 16.7* 22.7*  HGB 7.9* 7.6* 7.0*  HCT 23.3* 22.0* 20.9*  PLT 108* 81* 72*    Coag's No results for input(s): APTT, INR in the last 168 hours.  Sepsis Markers  Recent Labs Lab 12/30/2015 1552 01/02/2016 1851  LATICACIDVEN 8.25* 4.43*    ABG  Recent Labs Lab 01/07/16 0440 01/08/16 0530 21-Jan-2016 0505  PHART 7.508* 7.389 7.281*  PCO2ART 29.4* 32.1* 21.1*  PO2ART 141.0* 150* 148.0*    Liver Enzymes  Recent Labs Lab 01/07/2016 1544 01/06/16 0430  AST 50* 382*  ALT 39 343*  ALKPHOS 39 73  BILITOT 0.6 0.6  ALBUMIN 3.8 2.3*    Cardiac Enzymes  Recent Labs Lab  01/05/16 0945  TROPONINI 1.00*    Glucose  Recent Labs Lab 01/08/16 0739 01/08/16 1213 01/08/16 1754 01/08/16 2027 01/08/16 2349 01-29-16 0406  GLUCAP 161* 146* 208* 203* 171* 73    Imaging Dg Chest Port 1 View  Result Date: 01-29-16 CLINICAL DATA:  Intubation. EXAM: PORTABLE CHEST 1 VIEW COMPARISON:  01/08/2016 . FINDINGS: Left chest tube in stable position. Endotracheal tube, NG tube, right IJ line in stable position. Heart size normal. Low lung volumes with mild bibasilar atelectasis. No pleural effusion or pneumothorax. Possible gastric distention. Abdominal series suggested for  further evaluation. IMPRESSION: 1. Lines and tubes including left chest to in stable position. No pneumothorax. 2. Low lung volumes with mild bibasilar atelectasis. 3. Possible gastric distention. Abdominal series suggested for further evaluation. Electronically Signed   By: Marcello Moores  Register   On: 01-29-16 07:26    STUDIES:  CT abd 7/22 >> SBO, free air  CULTURES: Blood 7/22 >> NGTD Urine 7/22 >> NGTD  ANTIBIOTICS: Zosyn 7/22 >> Vancomycin 7/22 >> 7/22  SIGNIFICANT EVENTS: Distal ileum and colon resection; end ileostomy w/ [place] pouch 7/24 PEA arrest 7/26 Left pneumothorax, chest tube placement and central line placement   LINES/TUBES: PIV VasCath L IJ TLC 7/26 >> Left CT 7/26 >> ETT 7/22>>>   ASSESSMENT / PLAN:  1) Acute SBO w/ perforation s/p terminal ileum and colon resection with end ileostomy 2) Ventilator dependence. 3) Chronically elevated right hemidiaphragm  4) Left spontaneous PTX-->no airleak (this was likely d/t CPR and rib fx)-->no airleak on CT 7/28 5) PEA arrest (time to ROSC 9-10 minutes)-->post-cardiac arrest shock  6) Refractory shock/MODS (7/28) 7) Profound metabolic acidosis (99991111) 8) Vomiting (7/28) 9) Ileus (7/28) 10) ESRD on iHD TTSa 11) SHOCK LIVER  12) Hyperkalemia 13) Acute blood loss anemia - Hb drop from 11 to 7.3. Not sure how much of this is blood loss and how much is dilutional  14) Concern for evolving intraabdominal sepsis s/p bowel perforation-->could be bacterial translocation  15) Leukocytosis 16) Hypoglycemia post-operatively (iatrogenic) 17) Hx DMII  18) Hypothyroidism   19) Advanced dementia; now w/ acute encephalopathy-->suspect that this is a mix of sepsis, + acidosis +/- hypoxemic event after cardiac arrest.   Discussion   Shelly Cain is a 22F w/ PMH significant for ESRD on iHD TTSa, DMII, dementia, HTN, hx colon ca, who presented with acute small bowel obstruction and perforation and was taken urgently to the OR for  exploratory laparotomy with bowel resection and end ileostomy. Had sudden PEA arrest this am. ROSC 9-10 minutes. Occurred about 2 hours into HD. Suspect that this was metabolically mediated. We treated her for presumptive hyperkalemia and acidosis during the arrest. The patient is actively dying. She is in refractory shock. She is now unresponsive to deep noxious stimuli. As I had discussed w/ Shelly Cain (her son) and his wife; should she deteriorate we would not escalate.. Her course has not improved since her arrest she has suffered: iatrogenic left PTX (rib fx from CPR), refractory shock, ileus, rising wbc ct, profound acidosis and hyperkalemia. We have certainly reached the point of futility. There is nothing more that we can add that will change her overall outcome and the time has come to transition to comfort.   Plan Cont current vent settings Dc all lab work No more dialysis PRN morphine Extubate when family arrives.   Erick Colace ACNP-BC [place] Pager # [phone] OR # [phone] if no answer  PCCM Attending Note: Case discussed with  nurse practitioner and note reviewed. Patient passed prior to being able to examine or complete assessment. Documentation by nursing staff indicates asystole on telemetry at 9:15 AM. Family were notified of patient's passing.  Sonia Baller Ashok Cordia, M.D. [facility] Pulmonary & Critical Care Pager:  [phone] After 3pm or if no response, call [phone] 11:04 AM 2016/01/25

## 2016-01-13 NOTE — Progress Notes (Addendum)
HR brady on monitor to 20's; asystole on monitor 0915; no breath sounds; no heart sounds; Berenice Primas, RN and Norville Haggard, RN pronounced death; Marni Griffon, NP in room at this time; family notified of death.  Ruben Reason

## 2016-01-13 NOTE — Anesthesia Postprocedure Evaluation (Signed)
Anesthesia Post Note  Patient: Lyrah Mcpeek  Procedure(s) Performed: Procedure(s) (LRB): EXPLORATORY LAPAROTOMY (N/A) DISTAL ILEUM AND COLON RESECTION RIGHT (Right) TERMINAL ILEOSTOMY WITH HARTMANS POUCH (Right)  Patient location during evaluation: SICU Anesthesia Type: General Level of consciousness: sedated and patient remains intubated per anesthesia plan Pain management: pain level controlled Vital Signs Assessment: post-procedure vital signs reviewed and stable Respiratory status: patient remains intubated per anesthesia plan Cardiovascular status: stable Anesthetic complications: no    Last Vitals:  Vitals:   01-30-2016 0900 2016-01-30 0915  BP:    Pulse:    Resp: (!) 21 16  Temp:      Last Pain:  Vitals:   01/08/16 2351  TempSrc: Axillary  PainSc:                  Roan Sawchuk,JAMES TERRILL

## 2016-01-13 NOTE — Discharge Summary (Signed)
Death Note: For complete accounting of the patient's history and physical exam on presentation please refer to the consultation notes dictated by surgery and critical care services on 01/10/2016. In brief patient was a 81 year old female with history of multiple medical problems including end-stage renal disease on intermittent hemodialysis, diabetes, and dementia. Patient presented with report of abdominal pain with loose bowel movements. Upon evaluation in the emergency department she was discovered to have free air in the abdomen as well as a small bowel obstruction with transition point. Patient also notably had a lactic acidosis. Patient was taken to the operating room for exploratory laparotomy and found to have bowel necrosis requiring small bowel resection. Patient was intubated for her surgical procedure. Patient did have postoperative hypotension requiring vasopressor support. Patient suffered a pulseless electrical activity cardiac arrest on 01/05/16 presumably from hyperkalemia and metabolic acidosis. She developed a left sided pneumothorax post resuscitation that required placement of a chest tube. Despite vasopressor support, continuous renal replacement therapy, and broad-spectrum antibiotic therapy her clinical course continued to deteriorate. The patient was made a DO NOT RESUSCITATE by family. Despite aggressive medical care and again continue vasopressor support the patient became bradycardic and deteriorated to asystole on telemetry monitoring at 9:15 AM on 02-07-2016. The patient was pronounced dead without any breath sounds or heart sounds on auscultation. Family were notified of the patient's passing.  Diagnoses at Death: 1. Septic Shock 2. Acute Hypoxic Respiratory Failure 3. Left Pneumothorax Post CPR for Cardiac Arrest 4. Pulseless Electrical Activity Cardiac Arrest 5. Metabolic Acidosis 6. Acute Blood Loss Anemia 7. Acute Small Bowel Obstruction with Perforation 8. History of  Dementia 9. History of End-Stage Renal Disease on Dialysis 10. History of Hypertension 11. History of Diabetes Mellitus Type 2 12. History of Hypothryoidism

## 2016-01-13 NOTE — Progress Notes (Signed)
Dr. Jimmy Footman notified of critical ABG results. 2 amps of bicarb ordered. No other orders received at this time. Family notified of pt's deterioration. Will continue to monitor closely.  Eleonore Chiquito RN 2 [place]

## 2016-01-13 NOTE — Progress Notes (Signed)
[facility] notified of significant hypotension 71/32 in spite of being maxed out at current parameters of levophed (53mcg). Dr. Jimmy Footman spoke with and no futher orders received. Will continue to monitor closely.  Eleonore Chiquito RN 2 [place]

## 2016-01-13 NOTE — Progress Notes (Signed)
6 Days Post-Op  Subjective: PT con't to do poorly.  con't pressors  Objective: Vital signs in last 24 hours: Temp:  [96.5 F (35.8 C)-97.9 F (36.6 C)] 97.4 F (36.3 C) (07/27 2351) Pulse Rate:  [25-119] 71 (07/28 0715) Resp:  [11-39] 21 (07/28 0845) BP: (87-173)/(22-55) 123/36 (07/27 1630) SpO2:  [85 %-100 %] 95 % (07/28 0715) FiO2 (%):  [30 %-33 %] 30 % (07/28 0715) Weight:  [62.9 kg (138 lb 10.7 oz)-66 kg (145 lb 8.1 oz)] 64 kg (141 lb 1.5 oz) (07/28 0500) Last BM Date: 2016/02/03  Intake/Output from previous day: 07/27 0701 - 07/28 0700 In: 1817.5 [I.V.:1177.5; NG/GT:540; IV Piggyback:100] Out: 2589 [Emesis/NG output:800; [id number] Intake/Output this shift: No intake/output data recorded.  GEN: sedate GI: ostomy pink/patent   Lab Results:   Recent Labs  01/08/16 0327 02-03-2016 0400  WBC 16.7* 22.7*  HGB 7.6* 7.0*  HCT 22.0* 20.9*  PLT 81* 72*   BMET  Recent Labs  01/08/16 0327 02-03-16 0400  NA 126* 130*  K 4.9 5.9*  CL 90* 93*  CO2 20* 9*  GLUCOSE 137* 149*  BUN 83* 41*  CREATININE 5.08* 3.13*  CALCIUM 7.6* 8.9   PT/INR No results for input(s): LABPROT, INR in the last 72 hours. ABG  Recent Labs  01/08/16 0530 2016/02/03 0358 02-03-2016 0505  PHART 7.389  --  7.281*  HCO3 18.9* 8.6* 10.0*    Studies/Results: Dg Chest Port 1 View  Result Date: 2016-02-03 CLINICAL DATA:  Intubation. EXAM: PORTABLE CHEST 1 VIEW COMPARISON:  01/08/2016 . FINDINGS: Left chest tube in stable position. Endotracheal tube, NG tube, right IJ line in stable position. Heart size normal. Low lung volumes with mild bibasilar atelectasis. No pleural effusion or pneumothorax. Possible gastric distention. Abdominal series suggested for further evaluation. IMPRESSION: 1. Lines and tubes including left chest to in stable position. No pneumothorax. 2. Low lung volumes with mild bibasilar atelectasis. 3. Possible gastric distention. Abdominal series suggested for further evaluation.  Electronically Signed   By: Marcello Moores  Register   On: 02-03-16 07:26  Dg Chest Port 1 View  Result Date: 01/08/2016 CLINICAL DATA:  Respiratory failure. EXAM: PORTABLE CHEST 1 VIEW COMPARISON:  01/07/2016 . FINDINGS: Endotracheal tube, NG tube, right IJ line left IJ line,, left chest tube in stable position. Heart size normal. Low lung volumes with mild bibasilar atelectasis and/or infiltrate. No pleural effusion or pneumothorax. Prominent skin fold noted on the left. IMPRESSION: 1. Lines and tubes including left chest tube in stable position. No pneumothorax. 2. Low lung volumes with mild bibasilar atelectasis and/or infiltrates . Electronically Signed   By: Marcello Moores  Register   On: 01/08/2016 07:32  Dg Chest Port 1 View  Result Date: 01/07/2016 CLINICAL DATA:  Acute respiratory failure. EXAM: PORTABLE CHEST 1 VIEW COMPARISON:  01/07/2016 FINDINGS: 1016 hours. Endotracheal tube tip is 4.4 cm above the base of the carina. Right IJ central line tip projects at the proximal SVC level. There is a left IJ central line which is new in the interval and crosses the midline with the tip positioned in the right subclavian vein. NG tube tip overlies the gastric fundus. Left chest tube is new in the interval with evacuation of previously seen left pneumothorax. No focal airspace consolidation. No pulmonary edema or pleural effusion. IMPRESSION: 1. New left IJ central venous catheter tip crosses the midline with the tip positioned in the right subclavian vein. 2. Interval placement of left-sided chest tube with no residual pneumothorax evident. I discussed  these findings by telephone with Marni Griffon shortly after the exam was interpreted. Electronically Signed   By: Misty Stanley M.D.   On: 01/07/2016 11:04   Anti-infectives: Anti-infectives    Start     Dose/Rate Route Frequency Ordered Stop   01/06/16 1400  piperacillin-tazobactam (ZOSYN) IVPB 2.25 g  Status:  Discontinued     2.25 g 100 mL/hr over 30 Minutes  Intravenous Every 8 hours 01/06/16 1052 2016-01-27 0845   01/06/16 1200  piperacillin-tazobactam (ZOSYN) IVPB 2.25 g  Status:  Discontinued     2.25 g 100 mL/hr over 30 Minutes Intravenous Every 6 hours 01/06/16 1050 01/06/16 1052   01/12/2016 2200  piperacillin-tazobactam (ZOSYN) IVPB 2.25 g  Status:  Discontinued     2.25 g 100 mL/hr over 30 Minutes Intravenous Every 8 hours 12/26/2015 1654 01/06/16 1050   12/27/2015 1715  vancomycin (VANCOCIN) 500 mg in sodium chloride 0.9 % 100 mL IVPB     500 mg 100 mL/hr over 60 Minutes Intravenous  Once 12/22/2015 1654 12/25/2015 1835   12/30/2015 1615  piperacillin-tazobactam (ZOSYN) IVPB 3.375 g     3.375 g 100 mL/hr over 30 Minutes Intravenous  Once 01/05/2016 1613 01/08/2016 1730   01/06/2016 1615  vancomycin (VANCOCIN) IVPB 1000 mg/200 mL premix     1,000 mg 200 mL/hr over 60 Minutes Intravenous  Once 12/26/2015 1613 12/25/2015 1825      Assessment/Plan: s/p Procedure(s): EXPLORATORY LAPAROTOMY (N/A) DISTAL ILEUM AND COLON RESECTION RIGHT (Right) TERMINAL ILEOSTOMY WITH HARTMANS POUCH (Right) No surgical issues. Appears terminal  LOS: 6 days    Rosario Jacks., [employer] 2016/01/27

## 2016-01-13 DEATH — deceased

## 2016-02-09 ENCOUNTER — Ambulatory Visit: Payer: Self-pay | Admitting: Cardiovascular Disease

## 2016-02-11 ENCOUNTER — Ambulatory Visit: Payer: Medicare (Managed Care) | Admitting: Podiatry

## 2016-05-21 ENCOUNTER — Ambulatory Visit: Payer: Self-pay | Admitting: Internal Medicine

## 2016-05-24 ENCOUNTER — Ambulatory Visit: Payer: Self-pay | Admitting: Neurology

## 2017-12-24 IMAGING — CT CT ABD-PELV W/O CM
2 of 4 series · 8 of 46 positions shown, 9 images · non-contrast
Comparison: None.

CLINICAL DATA: Lower abdominal pain and lactic acidosis. End-stage
renal disease. History of colon cancer.

EXAM:
CT ABDOMEN AND PELVIS WITHOUT CONTRAST
TECHNIQUE: Multidetector CT imaging of the abdomen and pelvis was performed
following the standard protocol without IV contrast.

[Series 201: routine, idose (2) · axial · 0.83mm/px · z∈[+189,+589]mm · 5 of 104 slices shown, 6 images]
[im 12/104  soft-tissue]
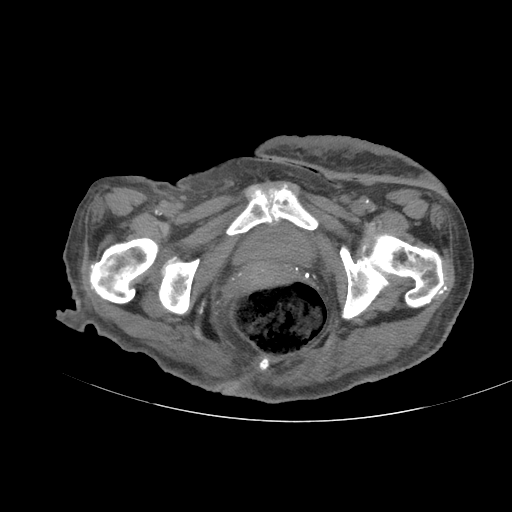
[im 12/104  bone]
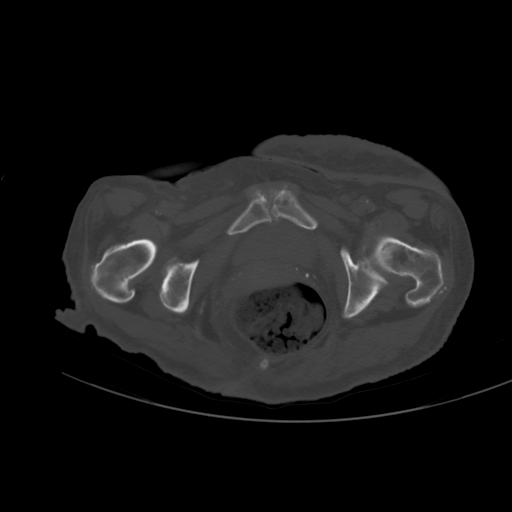
[im 32/104  soft-tissue]
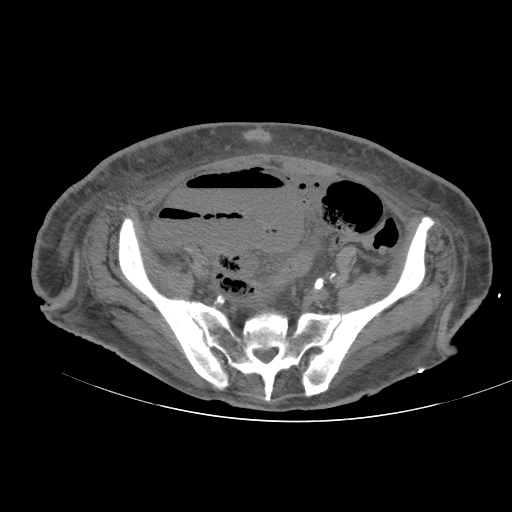
[im 52/104  soft-tissue]
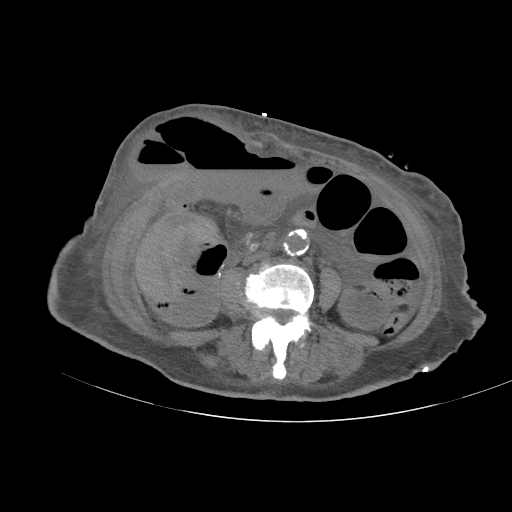
[im 72/104  soft-tissue]
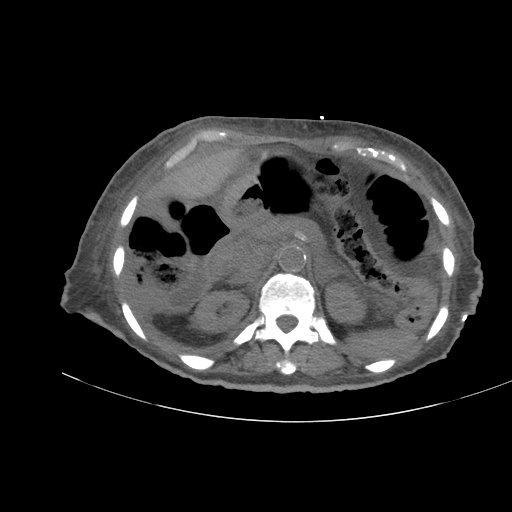
[im 92/104  soft-tissue]
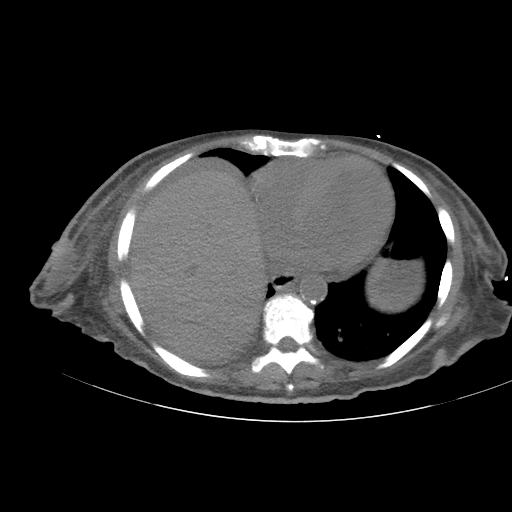

[Series 203: coronals, idose (2) · coronal · 0.45mm/px · 3 of 103 slices shown]
[im 35/103  soft-tissue]
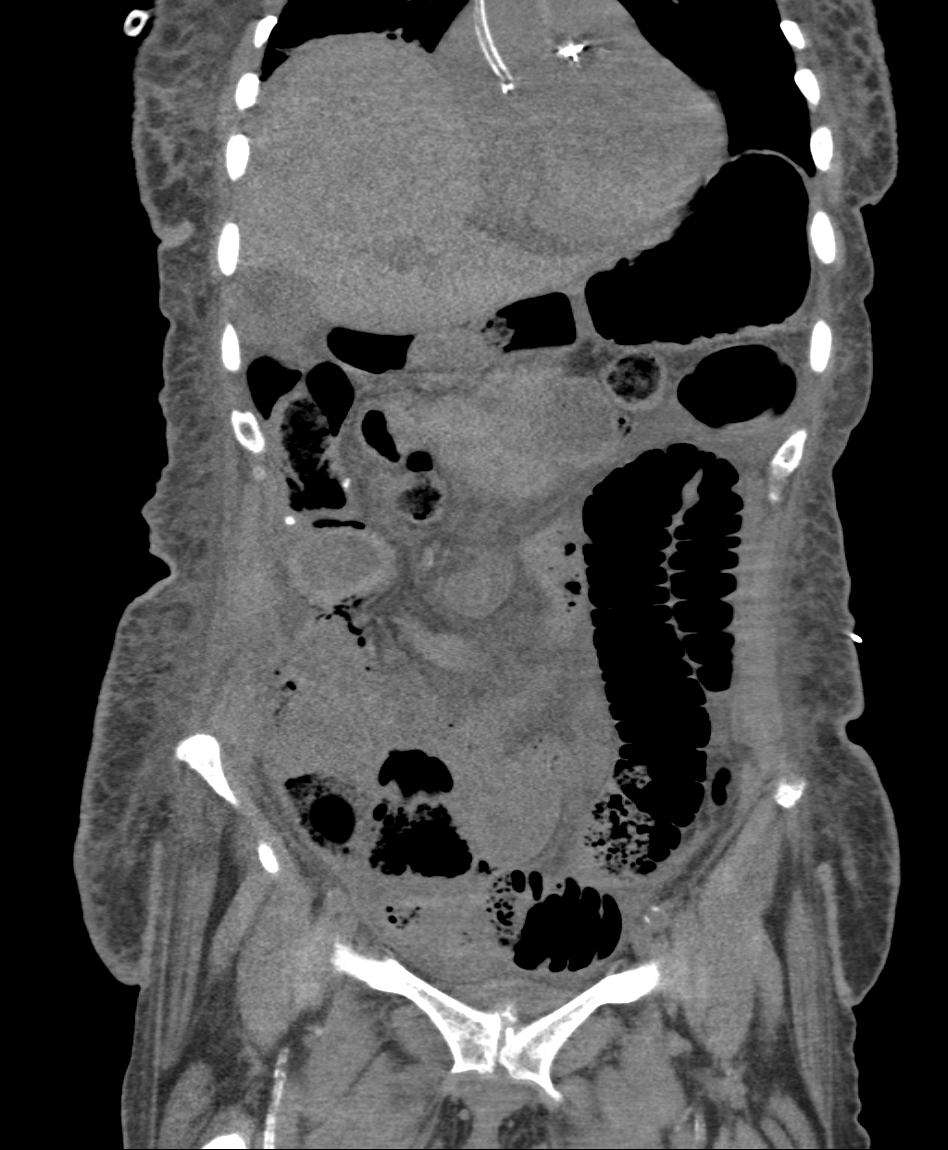
[im 46/103  soft-tissue]
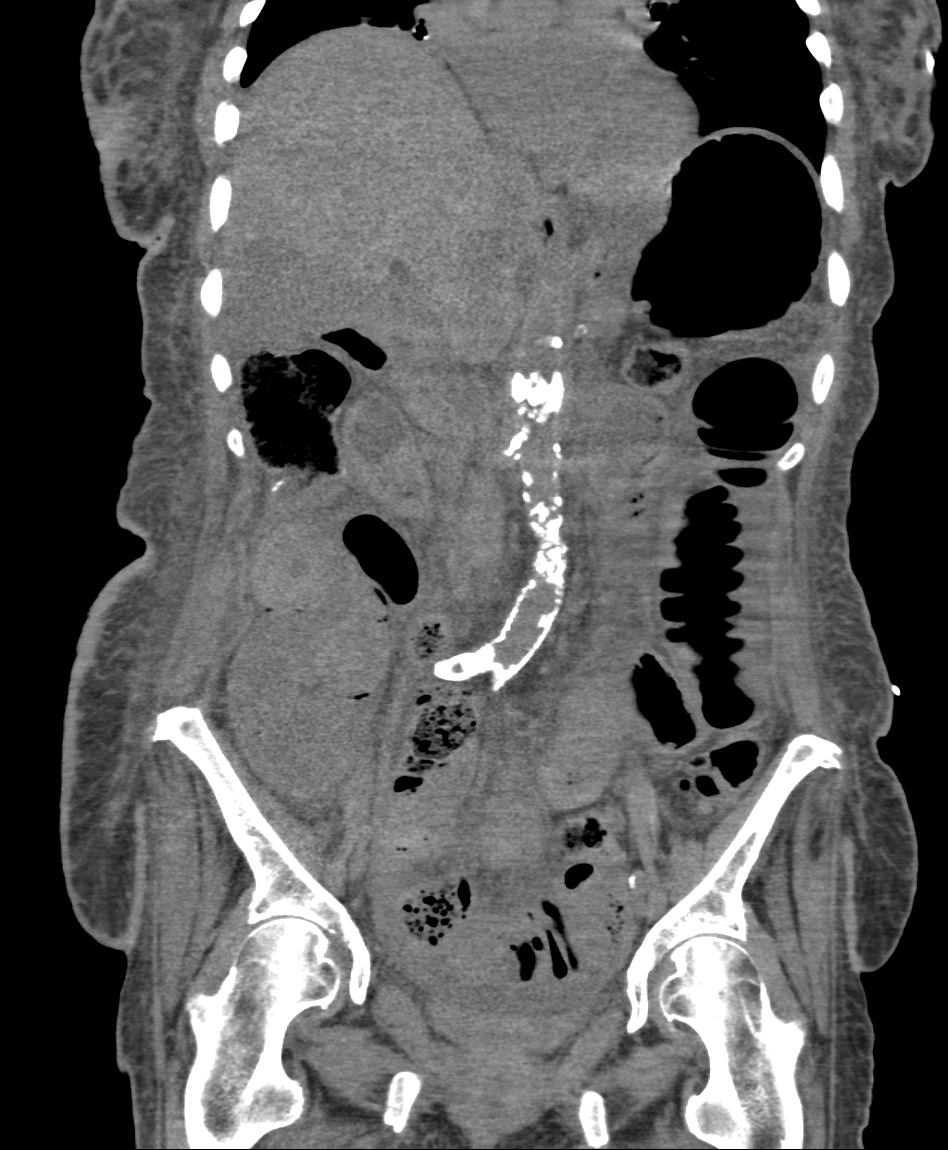
[im 57/103  soft-tissue]
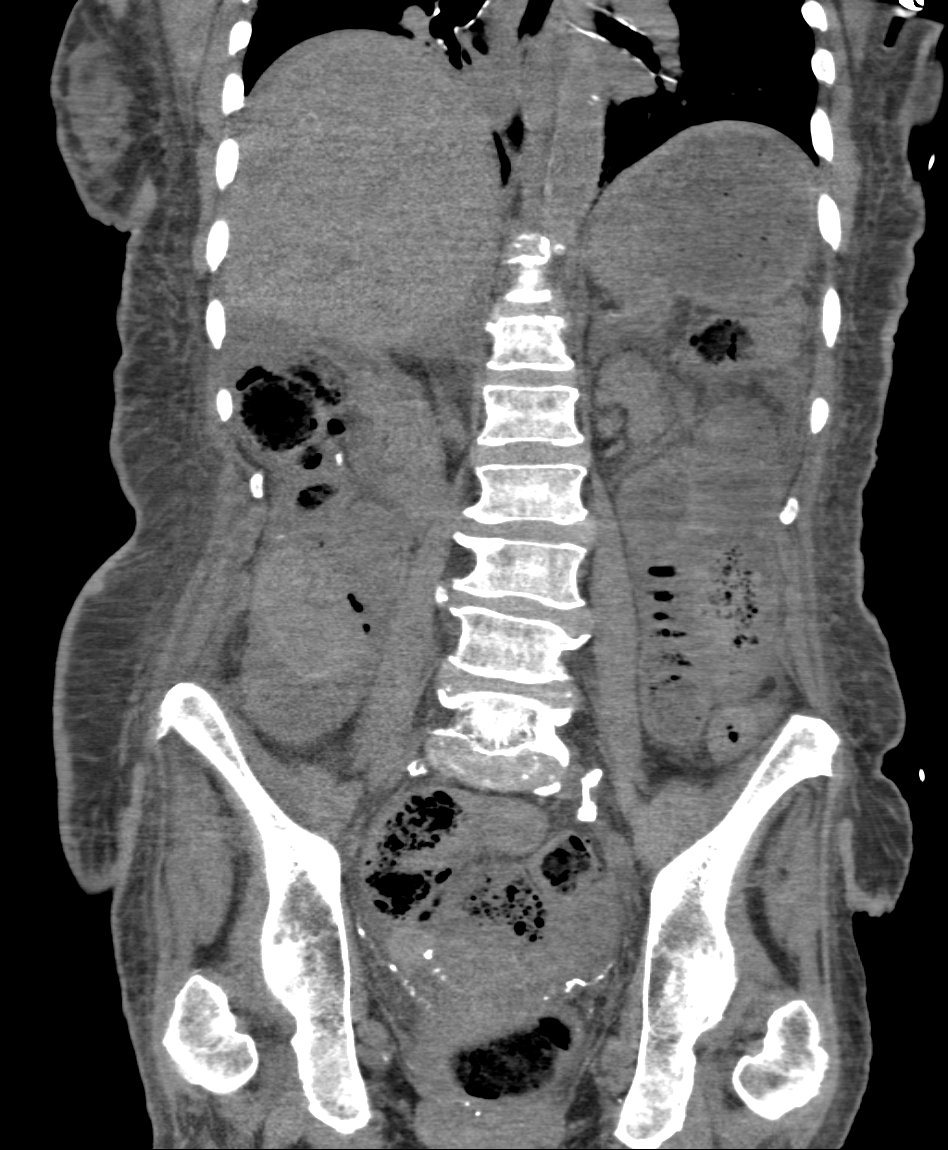

[8 of 46 positions shown; findings below may reference images not displayed]

FINDINGS: There is a small right effusion. Mild bibasilar atelectasis. No
other acute abnormalities in the lower chest.

There is free air throughout the abdomen. The colon is normal in
caliber and much smaller than the small bowel. The stomach is normal
in appearance. The small bowel loops are diffusely distended. Two
potential transition points are identified. First, there is a hernia
in the right anterior lateral abdomen as seen on axial image 56.
There appears to be an abrupt transition point between the dilated
loops of small bowel entering the hernia and a nondilated loop of
bowel leaving the hernia. I suspect this is the cause of the small
bowel obstruction. There is also an a tubular masslike process in
the right lower quadrant, just inferior to the anastomosis best seen
on axial images 50 through 61. It is possible that this is a mass.
However, I suspect it is more likely to represent abnormal small
bowel loops with wall thickening. There is ascites throughout the
abdomen. An oval region of low attenuation projected over the right
hepatic lobe on axial image 27 is thought to be an abnormal
gallbladder containing debris/ sludge with wall thickening and
pericholecystic fluid. The remainder of the liver is normal. The
spleen is unremarkable. Probable adrenal hyperplasia. Small kidneys
consistent with end-stage renal disease. The pancreas is normal.
Atherosclerosis is seen in the abdominal aorta. No obvious
adenopathy although evaluation is limited.

The pelvis demonstrates no adenopathy or mass. The bladder is
decompressed. No masses noted.

No evidence of bony metastatic disease. No acute bony abnormalities.
IMPRESSION: 1. There is free air in the abdomen consistent with bowel
perforation.
2. There is a small bowel obstruction. Evaluation for a transition
point is difficult given the lack of intra-abdominal fat and
contrast. Two potential sites of transition are identified. The
first is the right anterior lateral hernia. The loop of bowel
exiting the hernia appears to be relatively decompressed compared to
the more proximal dilated bowel. Additionally, there is a tubular
abnormality in the right lower quadrant described above. This is
favored to represent abnormal loops of bowel with wall thickening.
This is another persist potential site of obstruction. This
abnormality is thought to be less likely to represent a mass.
3. The gallbladder is abnormal in appearance with possible sludge,
wall thickening, and pericholecystic fluid. An ultrasound could
better evaluate.
The findings were called to Dr. Auntyjatty

## 2017-12-30 IMAGING — CR DG CHEST 1V PORT
1 series · 1 of 1 positions shown · non-contrast
Comparison: 01/08/2016 .

CLINICAL DATA: Intubation.

EXAM:
PORTABLE CHEST 1 VIEW

[AP]
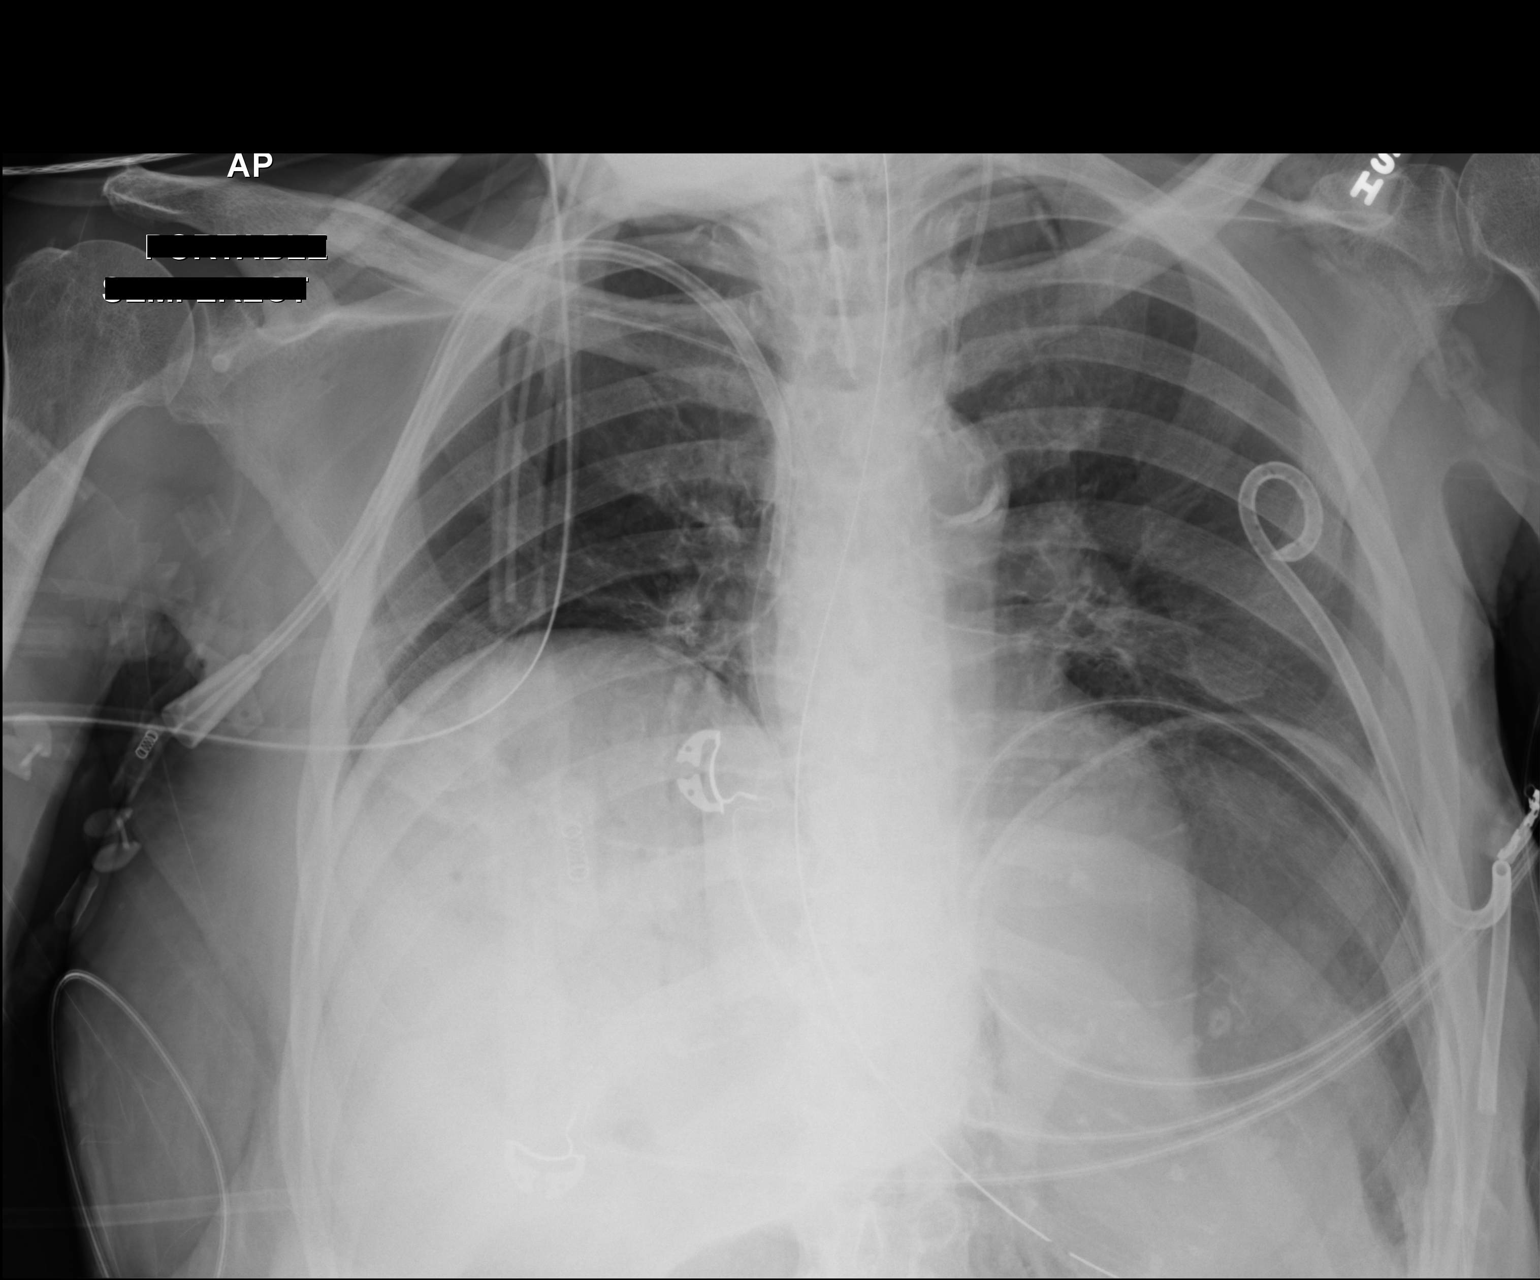

[1 of 1 positions shown; findings below may reference images not displayed]

FINDINGS: Left chest tube in stable position. Endotracheal tube, NG tube,
right IJ line in stable position. Heart size normal. Low lung
volumes with mild bibasilar atelectasis. No pleural effusion or
pneumothorax. Possible gastric distention. Abdominal series
suggested for further evaluation.
IMPRESSION: 1. Lines and tubes including left chest to in stable position. No
pneumothorax.
2. Low lung volumes with mild bibasilar atelectasis.
3. Possible gastric distention. Abdominal series suggested for
further evaluation.
